# Patient Record
Sex: Male | Born: 1960
Health system: Southern US, Community
[De-identification: ages and names within clinical notes are randomized; demographics above are authoritative.]

## PROBLEM LIST (undated history)

## (undated) DIAGNOSIS — T7840XA Allergy, unspecified, initial encounter: Secondary | ICD-10-CM

## (undated) DIAGNOSIS — C4492 Squamous cell carcinoma of skin, unspecified: Secondary | ICD-10-CM

## (undated) DIAGNOSIS — F32A Depression, unspecified: Secondary | ICD-10-CM

## (undated) DIAGNOSIS — M1990 Unspecified osteoarthritis, unspecified site: Secondary | ICD-10-CM

## (undated) DIAGNOSIS — L570 Actinic keratosis: Secondary | ICD-10-CM

## (undated) DIAGNOSIS — F329 Major depressive disorder, single episode, unspecified: Secondary | ICD-10-CM

## (undated) DIAGNOSIS — C801 Malignant (primary) neoplasm, unspecified: Secondary | ICD-10-CM

## (undated) HISTORY — DX: Squamous cell carcinoma of skin, unspecified: C44.92

## (undated) HISTORY — DX: Allergy, unspecified, initial encounter: T78.40XA

## (undated) HISTORY — PX: WISDOM TOOTH EXTRACTION: SHX21

## (undated) HISTORY — DX: Depression, unspecified: F32.A

## (undated) HISTORY — DX: Unspecified osteoarthritis, unspecified site: M19.90

## (undated) HISTORY — DX: Major depressive disorder, single episode, unspecified: F32.9

## (undated) HISTORY — PX: OTHER SURGICAL HISTORY: SHX169

## (undated) HISTORY — PX: TENDON RELEASE: SHX230

## (undated) HISTORY — DX: Malignant (primary) neoplasm, unspecified: C80.1

## (undated) HISTORY — PX: TOOTH EXTRACTION: SUR596

## (undated) HISTORY — DX: Actinic keratosis: L57.0

## (undated) HISTORY — PX: ROTATOR CUFF REPAIR: SHX139

---

## 2003-01-12 ENCOUNTER — Encounter: Payer: Self-pay | Admitting: Internal Medicine

## 2003-01-12 ENCOUNTER — Encounter: Admission: RE | Admit: 2003-01-12 | Discharge: 2003-01-12 | Payer: Self-pay | Admitting: Internal Medicine

## 2011-03-15 ENCOUNTER — Encounter: Payer: Self-pay | Admitting: Internal Medicine

## 2011-03-28 ENCOUNTER — Ambulatory Visit (AMBULATORY_SURGERY_CENTER): Payer: Federal, State, Local not specified - PPO

## 2011-03-28 VITALS — Ht 64.0 in | Wt 146.3 lb

## 2011-03-28 DIAGNOSIS — Z1211 Encounter for screening for malignant neoplasm of colon: Secondary | ICD-10-CM

## 2011-03-28 MED ORDER — PEG-KCL-NACL-NASULF-NA ASC-C 100 G PO SOLR: 1.0000 | Freq: Once | ORAL | Status: AC

## 2011-03-29 ENCOUNTER — Encounter: Payer: Self-pay | Admitting: Internal Medicine

## 2011-04-10 ENCOUNTER — Other Ambulatory Visit: Payer: Self-pay | Admitting: Internal Medicine

## 2011-04-25 ENCOUNTER — Encounter: Payer: Self-pay | Admitting: Internal Medicine

## 2011-04-25 ENCOUNTER — Ambulatory Visit (AMBULATORY_SURGERY_CENTER): Payer: Federal, State, Local not specified - PPO | Admitting: Internal Medicine

## 2011-04-25 VITALS — BP 116/65 | HR 65 | Temp 97.0°F | Resp 17 | Ht 64.0 in | Wt 140.0 lb

## 2011-04-25 DIAGNOSIS — Z1211 Encounter for screening for malignant neoplasm of colon: Secondary | ICD-10-CM

## 2011-04-25 HISTORY — PX: COLONOSCOPY: SHX174

## 2011-04-25 MED ORDER — SODIUM CHLORIDE 0.9 % IV SOLN: 500.0000 mL | INTRAVENOUS | Status: DC

## 2011-04-25 NOTE — Patient Instructions (Signed)
Follow your discharge instructions.  Resume your medications.  Next Colonoscopy in 10 years.

## 2011-04-26 ENCOUNTER — Telehealth: Payer: Self-pay | Admitting: *Deleted

## 2011-04-26 NOTE — Telephone Encounter (Signed)

## 2016-05-03 DIAGNOSIS — F4320 Adjustment disorder, unspecified: Secondary | ICD-10-CM | POA: Diagnosis not present

## 2016-05-17 DIAGNOSIS — C4491 Basal cell carcinoma of skin, unspecified: Secondary | ICD-10-CM | POA: Diagnosis not present

## 2016-05-17 DIAGNOSIS — F330 Major depressive disorder, recurrent, mild: Secondary | ICD-10-CM | POA: Diagnosis not present

## 2016-05-17 DIAGNOSIS — Z0000 Encounter for general adult medical examination without abnormal findings: Secondary | ICD-10-CM | POA: Diagnosis not present

## 2016-05-17 DIAGNOSIS — Z23 Encounter for immunization: Secondary | ICD-10-CM | POA: Diagnosis not present

## 2016-05-17 DIAGNOSIS — E785 Hyperlipidemia, unspecified: Secondary | ICD-10-CM | POA: Diagnosis not present

## 2016-05-17 DIAGNOSIS — D509 Iron deficiency anemia, unspecified: Secondary | ICD-10-CM | POA: Diagnosis not present

## 2016-05-17 DIAGNOSIS — Z125 Encounter for screening for malignant neoplasm of prostate: Secondary | ICD-10-CM | POA: Diagnosis not present

## 2016-05-18 DIAGNOSIS — D3132 Benign neoplasm of left choroid: Secondary | ICD-10-CM | POA: Diagnosis not present

## 2016-05-19 DIAGNOSIS — F4320 Adjustment disorder, unspecified: Secondary | ICD-10-CM | POA: Diagnosis not present

## 2016-06-09 DIAGNOSIS — F4320 Adjustment disorder, unspecified: Secondary | ICD-10-CM | POA: Diagnosis not present

## 2016-06-30 DIAGNOSIS — F4320 Adjustment disorder, unspecified: Secondary | ICD-10-CM | POA: Diagnosis not present

## 2016-07-21 DIAGNOSIS — F4320 Adjustment disorder, unspecified: Secondary | ICD-10-CM | POA: Diagnosis not present

## 2016-08-16 DIAGNOSIS — F4320 Adjustment disorder, unspecified: Secondary | ICD-10-CM | POA: Diagnosis not present

## 2016-09-08 DIAGNOSIS — F4320 Adjustment disorder, unspecified: Secondary | ICD-10-CM | POA: Diagnosis not present

## 2016-09-29 DIAGNOSIS — F4320 Adjustment disorder, unspecified: Secondary | ICD-10-CM | POA: Diagnosis not present

## 2016-10-20 DIAGNOSIS — F4320 Adjustment disorder, unspecified: Secondary | ICD-10-CM | POA: Diagnosis not present

## 2016-11-07 DIAGNOSIS — M1711 Unilateral primary osteoarthritis, right knee: Secondary | ICD-10-CM | POA: Diagnosis not present

## 2016-11-09 DIAGNOSIS — C792 Secondary malignant neoplasm of skin: Secondary | ICD-10-CM | POA: Diagnosis not present

## 2016-11-10 DIAGNOSIS — F4320 Adjustment disorder, unspecified: Secondary | ICD-10-CM | POA: Diagnosis not present

## 2016-12-01 DIAGNOSIS — F4320 Adjustment disorder, unspecified: Secondary | ICD-10-CM | POA: Diagnosis not present

## 2016-12-28 DIAGNOSIS — M1711 Unilateral primary osteoarthritis, right knee: Secondary | ICD-10-CM | POA: Diagnosis not present

## 2016-12-29 DIAGNOSIS — F4320 Adjustment disorder, unspecified: Secondary | ICD-10-CM | POA: Diagnosis not present

## 2017-01-19 DIAGNOSIS — F4320 Adjustment disorder, unspecified: Secondary | ICD-10-CM | POA: Diagnosis not present

## 2017-02-09 DIAGNOSIS — F4320 Adjustment disorder, unspecified: Secondary | ICD-10-CM | POA: Diagnosis not present

## 2017-03-09 DIAGNOSIS — F4320 Adjustment disorder, unspecified: Secondary | ICD-10-CM | POA: Diagnosis not present

## 2017-04-06 DIAGNOSIS — F4320 Adjustment disorder, unspecified: Secondary | ICD-10-CM | POA: Diagnosis not present

## 2017-04-27 DIAGNOSIS — F4320 Adjustment disorder, unspecified: Secondary | ICD-10-CM | POA: Diagnosis not present

## 2017-05-17 DIAGNOSIS — F411 Generalized anxiety disorder: Secondary | ICD-10-CM | POA: Diagnosis not present

## 2017-05-17 DIAGNOSIS — Z23 Encounter for immunization: Secondary | ICD-10-CM | POA: Diagnosis not present

## 2017-05-17 DIAGNOSIS — C4490 Unspecified malignant neoplasm of skin, unspecified: Secondary | ICD-10-CM | POA: Diagnosis not present

## 2017-05-17 DIAGNOSIS — Z6841 Body Mass Index (BMI) 40.0 and over, adult: Secondary | ICD-10-CM | POA: Diagnosis not present

## 2017-05-17 DIAGNOSIS — Z0000 Encounter for general adult medical examination without abnormal findings: Secondary | ICD-10-CM | POA: Diagnosis not present

## 2017-06-01 DIAGNOSIS — F4320 Adjustment disorder, unspecified: Secondary | ICD-10-CM | POA: Diagnosis not present

## 2017-06-29 DIAGNOSIS — F4320 Adjustment disorder, unspecified: Secondary | ICD-10-CM | POA: Diagnosis not present

## 2017-07-20 MED FILL — DULoxetine HCL 60 MG CPEP: 60 | 90 days supply | Qty: 90 | Fill #0

## 2017-08-03 DIAGNOSIS — F4320 Adjustment disorder, unspecified: Secondary | ICD-10-CM | POA: Diagnosis not present

## 2017-08-07 DIAGNOSIS — D649 Anemia, unspecified: Secondary | ICD-10-CM | POA: Diagnosis not present

## 2017-08-31 DIAGNOSIS — F4320 Adjustment disorder, unspecified: Secondary | ICD-10-CM | POA: Diagnosis not present

## 2017-09-28 DIAGNOSIS — F4320 Adjustment disorder, unspecified: Secondary | ICD-10-CM | POA: Diagnosis not present

## 2017-10-26 DIAGNOSIS — F4320 Adjustment disorder, unspecified: Secondary | ICD-10-CM | POA: Diagnosis not present

## 2017-12-07 DIAGNOSIS — F4320 Adjustment disorder, unspecified: Secondary | ICD-10-CM | POA: Diagnosis not present

## 2017-12-28 DIAGNOSIS — F4320 Adjustment disorder, unspecified: Secondary | ICD-10-CM | POA: Diagnosis not present

## 2018-01-25 DIAGNOSIS — F4320 Adjustment disorder, unspecified: Secondary | ICD-10-CM | POA: Diagnosis not present

## 2018-02-22 DIAGNOSIS — F4320 Adjustment disorder, unspecified: Secondary | ICD-10-CM | POA: Diagnosis not present

## 2018-04-19 DIAGNOSIS — F4320 Adjustment disorder, unspecified: Secondary | ICD-10-CM | POA: Diagnosis not present

## 2018-05-17 DIAGNOSIS — F419 Anxiety disorder, unspecified: Secondary | ICD-10-CM | POA: Diagnosis not present

## 2018-05-17 DIAGNOSIS — F329 Major depressive disorder, single episode, unspecified: Secondary | ICD-10-CM | POA: Diagnosis not present

## 2018-05-17 DIAGNOSIS — Z1211 Encounter for screening for malignant neoplasm of colon: Secondary | ICD-10-CM | POA: Diagnosis not present

## 2018-05-17 DIAGNOSIS — C4490 Unspecified malignant neoplasm of skin, unspecified: Secondary | ICD-10-CM | POA: Diagnosis not present

## 2018-05-21 DIAGNOSIS — Z23 Encounter for immunization: Secondary | ICD-10-CM | POA: Diagnosis not present

## 2018-05-31 DIAGNOSIS — F4320 Adjustment disorder, unspecified: Secondary | ICD-10-CM | POA: Diagnosis not present

## 2018-07-05 DIAGNOSIS — F4320 Adjustment disorder, unspecified: Secondary | ICD-10-CM | POA: Diagnosis not present

## 2018-08-21 DIAGNOSIS — F4320 Adjustment disorder, unspecified: Secondary | ICD-10-CM | POA: Diagnosis not present

## 2018-09-20 DIAGNOSIS — F4320 Adjustment disorder, unspecified: Secondary | ICD-10-CM | POA: Diagnosis not present

## 2019-05-02 DIAGNOSIS — M25511 Pain in right shoulder: Secondary | ICD-10-CM | POA: Diagnosis not present

## 2019-05-10 DIAGNOSIS — M25511 Pain in right shoulder: Secondary | ICD-10-CM | POA: Diagnosis not present

## 2019-05-14 DIAGNOSIS — M25511 Pain in right shoulder: Secondary | ICD-10-CM | POA: Diagnosis not present

## 2019-05-20 DIAGNOSIS — C4490 Unspecified malignant neoplasm of skin, unspecified: Secondary | ICD-10-CM | POA: Diagnosis not present

## 2019-05-20 DIAGNOSIS — F418 Other specified anxiety disorders: Secondary | ICD-10-CM | POA: Diagnosis not present

## 2019-05-20 DIAGNOSIS — Z125 Encounter for screening for malignant neoplasm of prostate: Secondary | ICD-10-CM | POA: Diagnosis not present

## 2019-05-20 DIAGNOSIS — D509 Iron deficiency anemia, unspecified: Secondary | ICD-10-CM | POA: Diagnosis not present

## 2019-05-20 DIAGNOSIS — Z008 Encounter for other general examination: Secondary | ICD-10-CM | POA: Diagnosis not present

## 2019-05-20 DIAGNOSIS — Z23 Encounter for immunization: Secondary | ICD-10-CM | POA: Diagnosis not present

## 2019-05-20 DIAGNOSIS — Z1211 Encounter for screening for malignant neoplasm of colon: Secondary | ICD-10-CM | POA: Diagnosis not present

## 2019-05-27 DIAGNOSIS — X58XXXA Exposure to other specified factors, initial encounter: Secondary | ICD-10-CM | POA: Diagnosis not present

## 2019-05-27 DIAGNOSIS — Z4889 Encounter for other specified surgical aftercare: Secondary | ICD-10-CM | POA: Diagnosis not present

## 2019-05-27 DIAGNOSIS — G8918 Other acute postprocedural pain: Secondary | ICD-10-CM | POA: Diagnosis not present

## 2019-05-27 DIAGNOSIS — Y999 Unspecified external cause status: Secondary | ICD-10-CM | POA: Diagnosis not present

## 2019-05-27 DIAGNOSIS — R600 Localized edema: Secondary | ICD-10-CM | POA: Diagnosis not present

## 2019-05-27 DIAGNOSIS — S46011A Strain of muscle(s) and tendon(s) of the rotator cuff of right shoulder, initial encounter: Secondary | ICD-10-CM | POA: Diagnosis not present

## 2019-05-27 DIAGNOSIS — S43431A Superior glenoid labrum lesion of right shoulder, initial encounter: Secondary | ICD-10-CM | POA: Diagnosis not present

## 2019-05-27 DIAGNOSIS — M19011 Primary osteoarthritis, right shoulder: Secondary | ICD-10-CM | POA: Diagnosis not present

## 2019-05-27 DIAGNOSIS — M7541 Impingement syndrome of right shoulder: Secondary | ICD-10-CM | POA: Diagnosis not present

## 2019-05-27 DIAGNOSIS — M75121 Complete rotator cuff tear or rupture of right shoulder, not specified as traumatic: Secondary | ICD-10-CM | POA: Diagnosis not present

## 2019-05-27 DIAGNOSIS — S43491A Other sprain of right shoulder joint, initial encounter: Secondary | ICD-10-CM | POA: Diagnosis not present

## 2019-05-27 DIAGNOSIS — M25511 Pain in right shoulder: Secondary | ICD-10-CM | POA: Diagnosis not present

## 2019-06-04 DIAGNOSIS — M25611 Stiffness of right shoulder, not elsewhere classified: Secondary | ICD-10-CM | POA: Diagnosis not present

## 2019-06-04 DIAGNOSIS — M25511 Pain in right shoulder: Secondary | ICD-10-CM | POA: Diagnosis not present

## 2019-06-09 DIAGNOSIS — M25611 Stiffness of right shoulder, not elsewhere classified: Secondary | ICD-10-CM | POA: Diagnosis not present

## 2019-06-16 DIAGNOSIS — M25611 Stiffness of right shoulder, not elsewhere classified: Secondary | ICD-10-CM | POA: Diagnosis not present

## 2019-06-23 DIAGNOSIS — M25611 Stiffness of right shoulder, not elsewhere classified: Secondary | ICD-10-CM | POA: Diagnosis not present

## 2019-06-30 DIAGNOSIS — M25611 Stiffness of right shoulder, not elsewhere classified: Secondary | ICD-10-CM | POA: Diagnosis not present

## 2019-07-07 DIAGNOSIS — M25611 Stiffness of right shoulder, not elsewhere classified: Secondary | ICD-10-CM | POA: Diagnosis not present

## 2019-07-10 DIAGNOSIS — M25611 Stiffness of right shoulder, not elsewhere classified: Secondary | ICD-10-CM | POA: Diagnosis not present

## 2019-07-14 DIAGNOSIS — M25511 Pain in right shoulder: Secondary | ICD-10-CM | POA: Diagnosis not present

## 2019-07-14 DIAGNOSIS — M25611 Stiffness of right shoulder, not elsewhere classified: Secondary | ICD-10-CM | POA: Diagnosis not present

## 2019-07-17 DIAGNOSIS — M25611 Stiffness of right shoulder, not elsewhere classified: Secondary | ICD-10-CM | POA: Diagnosis not present

## 2019-07-21 DIAGNOSIS — M25511 Pain in right shoulder: Secondary | ICD-10-CM | POA: Diagnosis not present

## 2019-07-21 DIAGNOSIS — M25611 Stiffness of right shoulder, not elsewhere classified: Secondary | ICD-10-CM | POA: Diagnosis not present

## 2019-07-24 DIAGNOSIS — M25611 Stiffness of right shoulder, not elsewhere classified: Secondary | ICD-10-CM | POA: Diagnosis not present

## 2019-07-28 DIAGNOSIS — M25511 Pain in right shoulder: Secondary | ICD-10-CM | POA: Diagnosis not present

## 2019-07-28 DIAGNOSIS — M25611 Stiffness of right shoulder, not elsewhere classified: Secondary | ICD-10-CM | POA: Diagnosis not present

## 2019-07-31 DIAGNOSIS — M25611 Stiffness of right shoulder, not elsewhere classified: Secondary | ICD-10-CM | POA: Diagnosis not present

## 2019-07-31 DIAGNOSIS — M25511 Pain in right shoulder: Secondary | ICD-10-CM | POA: Diagnosis not present

## 2019-08-07 ENCOUNTER — Other Ambulatory Visit: Payer: Self-pay

## 2019-08-07 DIAGNOSIS — Z20822 Contact with and (suspected) exposure to covid-19: Secondary | ICD-10-CM

## 2019-08-09 LAB — NOVEL CORONAVIRUS, NAA: SARS-CoV-2, NAA: NOT DETECTED

## 2019-08-18 DIAGNOSIS — M25611 Stiffness of right shoulder, not elsewhere classified: Secondary | ICD-10-CM | POA: Diagnosis not present

## 2019-08-21 DIAGNOSIS — M25611 Stiffness of right shoulder, not elsewhere classified: Secondary | ICD-10-CM | POA: Diagnosis not present

## 2019-08-21 DIAGNOSIS — M25511 Pain in right shoulder: Secondary | ICD-10-CM | POA: Diagnosis not present

## 2019-08-25 DIAGNOSIS — M25611 Stiffness of right shoulder, not elsewhere classified: Secondary | ICD-10-CM | POA: Diagnosis not present

## 2019-08-25 DIAGNOSIS — M25511 Pain in right shoulder: Secondary | ICD-10-CM | POA: Diagnosis not present

## 2019-08-27 DIAGNOSIS — M25511 Pain in right shoulder: Secondary | ICD-10-CM | POA: Diagnosis not present

## 2019-08-27 DIAGNOSIS — M25611 Stiffness of right shoulder, not elsewhere classified: Secondary | ICD-10-CM | POA: Diagnosis not present

## 2019-09-01 DIAGNOSIS — M25611 Stiffness of right shoulder, not elsewhere classified: Secondary | ICD-10-CM | POA: Diagnosis not present

## 2019-09-01 DIAGNOSIS — M25511 Pain in right shoulder: Secondary | ICD-10-CM | POA: Diagnosis not present

## 2019-10-16 DIAGNOSIS — Z1331 Encounter for screening for depression: Secondary | ICD-10-CM | POA: Diagnosis not present

## 2019-10-16 DIAGNOSIS — F418 Other specified anxiety disorders: Secondary | ICD-10-CM | POA: Diagnosis not present

## 2019-10-16 DIAGNOSIS — Z85828 Personal history of other malignant neoplasm of skin: Secondary | ICD-10-CM | POA: Diagnosis not present

## 2019-11-20 ENCOUNTER — Ambulatory Visit: Payer: Federal, State, Local not specified - PPO | Attending: Internal Medicine

## 2019-11-20 DIAGNOSIS — Z23 Encounter for immunization: Secondary | ICD-10-CM

## 2019-11-20 NOTE — Progress Notes (Signed)
   Covid-19 Vaccination Clinic  Name:  Dakota Blair    MRN: [id number] DOB: 20-Nov-1960  11/20/2019  Mr. Dakota Blair was observed post Covid-19 immunization for 15 minutes without incident. He was provided with Vaccine Information Sheet and instruction to access the V-Safe system.   Mr. Dakota Blair was instructed to call 911 with any severe reactions post vaccine: Marland Kitchen Difficulty breathing  . Swelling of face and throat  . A fast heartbeat  . A bad rash all over body  . Dizziness and weakness   Immunizations Administered    Name Date Dose VIS Date Route   Pfizer COVID-19 Vaccine 11/20/2019  5:05 PM 0.3 mL 08/29/2019 Intramuscular   Manufacturer: [place]   Lot: [id number]   [place]: [id number]

## 2019-12-17 ENCOUNTER — Ambulatory Visit: Payer: Federal, State, Local not specified - PPO | Attending: Internal Medicine

## 2019-12-17 DIAGNOSIS — Z23 Encounter for immunization: Secondary | ICD-10-CM

## 2019-12-17 NOTE — Progress Notes (Signed)
   Covid-19 Vaccination Clinic  Name:  Dakota Blair    MRN: [id number] DOB: November 17, 1960  12/17/2019  [name] was observed post Covid-19 immunization for 15 minutes without incident. He was provided with Vaccine Information Sheet and instruction to access the V-Safe system.   [name] was instructed to call 911 with any severe reactions post vaccine: Marland Kitchen Difficulty breathing  . Swelling of face and throat  . A fast heartbeat  . A bad rash all over body  . Dizziness and weakness   Immunizations Administered    Name Date Dose VIS Date Route   Pfizer COVID-19 Vaccine 12/17/2019  4:16 PM 0.3 mL 08/29/2019 Intramuscular   Manufacturer: [place]   Lot: [id number]   [place]: [id number]

## 2020-04-09 DIAGNOSIS — F4320 Adjustment disorder, unspecified: Secondary | ICD-10-CM | POA: Diagnosis not present

## 2020-04-30 DIAGNOSIS — F4320 Adjustment disorder, unspecified: Secondary | ICD-10-CM | POA: Diagnosis not present

## 2020-06-02 DIAGNOSIS — L237 Allergic contact dermatitis due to plants, except food: Secondary | ICD-10-CM | POA: Diagnosis not present

## 2020-06-04 DIAGNOSIS — F4320 Adjustment disorder, unspecified: Secondary | ICD-10-CM | POA: Diagnosis not present

## 2020-06-09 DIAGNOSIS — Z0000 Encounter for general adult medical examination without abnormal findings: Secondary | ICD-10-CM | POA: Diagnosis not present

## 2020-06-09 DIAGNOSIS — Z125 Encounter for screening for malignant neoplasm of prostate: Secondary | ICD-10-CM | POA: Diagnosis not present

## 2020-06-09 DIAGNOSIS — E785 Hyperlipidemia, unspecified: Secondary | ICD-10-CM | POA: Diagnosis not present

## 2020-06-15 DIAGNOSIS — Z23 Encounter for immunization: Secondary | ICD-10-CM | POA: Diagnosis not present

## 2020-06-15 DIAGNOSIS — Z0000 Encounter for general adult medical examination without abnormal findings: Secondary | ICD-10-CM | POA: Diagnosis not present

## 2020-06-15 DIAGNOSIS — E785 Hyperlipidemia, unspecified: Secondary | ICD-10-CM | POA: Diagnosis not present

## 2020-06-15 DIAGNOSIS — M25551 Pain in right hip: Secondary | ICD-10-CM | POA: Diagnosis not present

## 2020-07-09 DIAGNOSIS — F4320 Adjustment disorder, unspecified: Secondary | ICD-10-CM | POA: Diagnosis not present

## 2020-07-12 DIAGNOSIS — M1611 Unilateral primary osteoarthritis, right hip: Secondary | ICD-10-CM | POA: Diagnosis not present

## 2020-07-27 DIAGNOSIS — H43391 Other vitreous opacities, right eye: Secondary | ICD-10-CM | POA: Diagnosis not present

## 2020-07-27 DIAGNOSIS — D3132 Benign neoplasm of left choroid: Secondary | ICD-10-CM | POA: Diagnosis not present

## 2020-07-27 DIAGNOSIS — H43811 Vitreous degeneration, right eye: Secondary | ICD-10-CM | POA: Diagnosis not present

## 2020-07-27 DIAGNOSIS — H33321 Round hole, right eye: Secondary | ICD-10-CM | POA: Diagnosis not present

## 2020-07-28 DIAGNOSIS — M25551 Pain in right hip: Secondary | ICD-10-CM | POA: Diagnosis not present

## 2020-08-06 DIAGNOSIS — Z1212 Encounter for screening for malignant neoplasm of rectum: Secondary | ICD-10-CM | POA: Diagnosis not present

## 2020-08-06 DIAGNOSIS — H33321 Round hole, right eye: Secondary | ICD-10-CM | POA: Diagnosis not present

## 2020-08-06 LAB — IFOBT (OCCULT BLOOD): IFOBT: NEGATIVE

## 2020-08-20 DIAGNOSIS — F4320 Adjustment disorder, unspecified: Secondary | ICD-10-CM | POA: Diagnosis not present

## 2020-09-24 DIAGNOSIS — F4320 Adjustment disorder, unspecified: Secondary | ICD-10-CM | POA: Diagnosis not present

## 2020-10-22 DIAGNOSIS — F4320 Adjustment disorder, unspecified: Secondary | ICD-10-CM | POA: Diagnosis not present

## 2020-11-26 DIAGNOSIS — F4320 Adjustment disorder, unspecified: Secondary | ICD-10-CM | POA: Diagnosis not present

## 2021-01-07 DIAGNOSIS — F4320 Adjustment disorder, unspecified: Secondary | ICD-10-CM | POA: Diagnosis not present

## 2021-02-04 DIAGNOSIS — F4320 Adjustment disorder, unspecified: Secondary | ICD-10-CM | POA: Diagnosis not present

## 2021-03-04 DIAGNOSIS — F4320 Adjustment disorder, unspecified: Secondary | ICD-10-CM | POA: Diagnosis not present

## 2021-03-23 ENCOUNTER — Encounter: Payer: Self-pay | Admitting: Internal Medicine

## 2021-04-15 DIAGNOSIS — F4320 Adjustment disorder, unspecified: Secondary | ICD-10-CM | POA: Diagnosis not present

## 2021-05-19 DIAGNOSIS — Z860101 Personal history of adenomatous and serrated colon polyps: Secondary | ICD-10-CM

## 2021-05-19 DIAGNOSIS — Z86010 Personal history of colonic polyps: Secondary | ICD-10-CM

## 2021-05-19 HISTORY — DX: Personal history of adenomatous and serrated colon polyps: Z86.0101

## 2021-05-19 HISTORY — DX: Personal history of colonic polyps: Z86.010

## 2021-05-20 DIAGNOSIS — F4320 Adjustment disorder, unspecified: Secondary | ICD-10-CM | POA: Diagnosis not present

## 2021-06-02 ENCOUNTER — Ambulatory Visit (AMBULATORY_SURGERY_CENTER): Payer: Federal, State, Local not specified - PPO | Admitting: *Deleted

## 2021-06-02 ENCOUNTER — Other Ambulatory Visit: Payer: Self-pay

## 2021-06-02 VITALS — Ht 64.0 in | Wt 148.0 lb

## 2021-06-02 DIAGNOSIS — Z1211 Encounter for screening for malignant neoplasm of colon: Secondary | ICD-10-CM

## 2021-06-02 NOTE — Progress Notes (Signed)
Pt verified name, DOB, address and insurance during PV today.  Pt mailed instruction packet of Emmi video, copy of consent form to read and not return, and instructions.  PV completed over the phone.  Pt encouraged to call with questions or issues.  My Chart instructions to pt as well    No egg or soy allergy known to patient  No issues known to pt with past sedation with any surgeries or procedures Patient denies ever being told they had issues or difficulty with intubation  No FH of Malignant Hyperthermia Pt is not on diet pills Pt is not on  home 02  Pt is not on blood thinners  Pt denies issues with constipation  No A fib or A flutter   Pt is fully vaccinated  for Covid   Due to the COVID-19 pandemic we are asking patients to follow certain guidelines.  Pt aware of COVID protocols and [facility] guidelines

## 2021-06-16 ENCOUNTER — Ambulatory Visit (AMBULATORY_SURGERY_CENTER): Payer: Federal, State, Local not specified - PPO | Admitting: Internal Medicine

## 2021-06-16 ENCOUNTER — Encounter: Payer: Self-pay | Admitting: Internal Medicine

## 2021-06-16 ENCOUNTER — Other Ambulatory Visit: Payer: Self-pay

## 2021-06-16 VITALS — BP 104/58 | HR 68 | Temp 98.0°F | Resp 17 | Ht 63.5 in | Wt 147.0 lb

## 2021-06-16 DIAGNOSIS — Z1211 Encounter for screening for malignant neoplasm of colon: Secondary | ICD-10-CM

## 2021-06-16 DIAGNOSIS — D123 Benign neoplasm of transverse colon: Secondary | ICD-10-CM

## 2021-06-16 MED ORDER — SODIUM CHLORIDE 0.9 % IV SOLN: 500.0000 mL | INTRAVENOUS | Status: DC

## 2021-06-16 NOTE — Progress Notes (Signed)
[facility] History and Physical   Primary Care Physician:  Michael Boston, MD   Reason for Procedure:   Colon cancer screening  Plan:    colonoscopy     HPI: Dakota Blair is a 60 y.o. male here for screening colonoscopy. 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 Authorizing Provider  DULoxetine (CYMBALTA) 60 MG capsule Take 60 mg by mouth daily.     Yes [provider]  loratadine (CLARITIN) 10 MG tablet Take by mouth.   Yes [provider]  Multiple Vitamins-Minerals (CENTRUM SILVER ULTRA MENS PO) Take by mouth daily.     Yes [provider]    Current Outpatient Medications  Medication Sig Dispense Refill   DULoxetine (CYMBALTA) 60 MG capsule Take 60 mg by mouth daily.       loratadine (CLARITIN) 10 MG tablet Take by mouth.     Multiple Vitamins-Minerals (CENTRUM SILVER ULTRA MENS PO) Take by mouth daily.       Current Facility-Administered Medications  Medication Dose Route Frequency Provider Last Rate Last Admin   0.9 %  sodium chloride infusion  500 mL Intravenous Continuous Gatha Mayer, MD        Allergies as of 06/16/2021 - Review Complete 06/16/2021  Allergen Reaction Noted   Penicillins Rash 03/28/2011    Family History  Problem Relation Age of Onset   Pancreatic cancer Mother    Heart disease Father    Colon cancer Neg Hx    Colon polyps Neg Hx    Esophageal cancer Neg Hx    Rectal cancer Neg Hx    Stomach cancer Neg Hx      Social History   Socioeconomic History   Marital status: Married                        Tobacco Use   Smoking status: Never   Smokeless tobacco: Never  Substance and Sexual Activity   Alcohol use: Yes    Alcohol/week: 5.0 standard drinks    Types: 5 drink(s) per week    Comment: occ   Drug use: No   Review of Systems: other review of systems negative except as mentioned in the HPI.  Physical Exam: Vital signs BP (!) 124/59   Pulse 68   Temp 98 F (36.7 C) (Temporal)   Ht 5' 3.5" (1.613 m)   Wt 147 lb (66.7 kg)   SpO2 100%   BMI 25.63 kg/m   General:   Alert,  Well-developed, well-nourished, pleasant and cooperative in NAD Lungs:  Clear throughout to auscultation.   Heart:  Regular rate and rhythm; no murmurs, clicks, rubs,  or gallops. Abdomen:  Soft, nontender and nondistended. Normal bowel sounds.   Neuro/Psych:  Alert and cooperative. Normal mood and affect. A and O  x 3   @Dakota Blair  Dakota Maffucci, MD, [facility] [phone] (pager) 06/16/2021 11:47 AM@

## 2021-06-16 NOTE — Progress Notes (Signed)
Called to room to assist during endoscopic procedure.  Patient ID and intended procedure confirmed with present staff. Received instructions for my participation in the procedure from the performing physician.  

## 2021-06-16 NOTE — Op Note (Signed)
[facility] Patient Name: Dakota Blair Procedure Date: 06/16/2021 11:45 AM MRN: [id number] Endoscopist: Gatha Mayer , MD Age: 60 Referring MD:  Date of Birth: 1961-05-09 Gender: Male Account #: [id number] Procedure:                Colonoscopy Indications:              Screening for colorectal malignant neoplasm, Last                            colonoscopy: 2012 Medicines:                Propofol per Anesthesia, Monitored Anesthesia Care Procedure:                Pre-Anesthesia Assessment:                           - Prior to the procedure, a History and Physical                            was performed, and patient medications and                            allergies were reviewed. The patient's tolerance of                            previous anesthesia was also reviewed. The risks                            and benefits of the procedure and the sedation                            options and risks were discussed with the patient.                            All questions were answered, and informed consent                            was obtained. Prior Anticoagulants: The patient has                            taken no previous anticoagulant or antiplatelet                            agents. ASA Grade Assessment: II - A patient with                            mild systemic disease. After reviewing the risks                            and benefits, the patient was deemed in                            satisfactory condition to undergo the procedure.  After obtaining informed consent, the colonoscope                            was passed under direct vision. Throughout the                            procedure, the patient's blood pressure, pulse, and                            oxygen saturations were monitored continuously. The                            Olympus CF-HQ190L ([id number]) Colonoscope was                            introduced through the  anus and advanced to the the                            cecum, identified by appendiceal orifice and                            ileocecal valve. The colonoscopy was performed                            without difficulty. The patient tolerated the                            procedure well. The quality of the bowel                            preparation was excellent. The ileocecal valve,                            appendiceal orifice, and rectum were photographed.                            The bowel preparation used was Miralax via split                            dose instruction. Scope In: 11:57:42 AM Scope Out: 12:15:43 PM Scope Withdrawal Time: 0 hours 11 minutes 10 seconds  Total Procedure Duration: 0 hours 18 minutes 1 second  Findings:                 The perianal and digital rectal examinations were                            normal. Pertinent negatives include normal prostate                            (size, shape, and consistency).                           A 1 mm polyp was found in the transverse colon. The  polyp was sessile. The polyp was removed with a                            cold biopsy forceps. Resection and retrieval were                            complete. Verification of patient identification                            for the specimen was done. Estimated blood loss was                            minimal.                           The exam was otherwise without abnormality on                            direct and retroflexion views. Complications:            No immediate complications. Estimated Blood Loss:     Estimated blood loss was minimal. Impression:               - One 1 mm polyp in the transverse colon, removed                            with a cold biopsy forceps. Resected and retrieved.                           - The examination was otherwise normal on direct                            and retroflexion views. Recommendation:            - Patient has a contact number available for                            emergencies. The signs and symptoms of potential                            delayed complications were discussed with the                            patient. Return to normal activities tomorrow.                            Written discharge instructions were provided to the                            patient.                           - Resume previous diet.                           - Continue present medications.                           -  Repeat colonoscopy is recommended. The                            colonoscopy date will be determined after pathology                            results from today's exam become available for                            review. Gatha Mayer, MD 06/16/2021 12:20:17 PM This report has been signed electronically.

## 2021-06-16 NOTE — Progress Notes (Signed)
Pt's states no medical or surgical changes since previsit or office visit. 

## 2021-06-16 NOTE — Progress Notes (Signed)
Vss nad transferred to pacu 

## 2021-06-16 NOTE — Patient Instructions (Addendum)
I found and removed one tiny polyp that looks benign. I will let you know pathology results and when to have another routine colonoscopy by mail and/or My Chart.  I appreciate the opportunity to care for you. Gatha Mayer, MD, [facility]  Please see handouts given to you on Polyps.   YOU HAD AN ENDOSCOPIC PROCEDURE TODAY AT [facility]:   Refer to the procedure report that was given to you for any specific questions about what was found during the examination.  If the procedure report does not answer your questions, please call your gastroenterologist to clarify.  If you requested that your care partner not be given the details of your procedure findings, then the procedure report has been included in a sealed envelope for you to review at your convenience later.  YOU SHOULD EXPECT: Some feelings of bloating in the abdomen. Passage of more gas than usual.  Walking can help get rid of the air that was put into your GI tract during the procedure and reduce the bloating. If you had a lower endoscopy (such as a colonoscopy or flexible sigmoidoscopy) you may notice spotting of blood in your stool or on the toilet paper. If you underwent a bowel prep for your procedure, you may not have a normal bowel movement for a few days.  Please Note:  You might notice some irritation and congestion in your nose or some drainage.  This is from the oxygen used during your procedure.  There is no need for concern and it should clear up in a day or so.  SYMPTOMS TO REPORT IMMEDIATELY:  Following lower endoscopy (colonoscopy or flexible sigmoidoscopy):  Excessive amounts of blood in the stool  Significant tenderness or worsening of abdominal pains  Swelling of the abdomen that is new, acute  Fever of 100F or higher   For urgent or emergent issues, a gastroenterologist can be reached at any hour by calling [phone]. Do not use MyChart messaging for urgent concerns.    DIET:  We do recommend  a small meal at first, but then you may proceed to your regular diet.  Drink plenty of fluids but you should avoid alcoholic beverages for 24 hours.  ACTIVITY:  You should plan to take it easy for the rest of today and you should NOT DRIVE or use heavy machinery until tomorrow (because of the sedation medicines used during the test).    FOLLOW UP: Our staff will call the number listed on your records 48-72 hours following your procedure to check on you and address any questions or concerns that you may have regarding the information given to you following your procedure. If we do not reach you, we will leave a message.  We will attempt to reach you two times.  During this call, we will ask if you have developed any symptoms of COVID 19. If you develop any symptoms (ie: fever, flu-like symptoms, shortness of breath, cough etc.) before then, please call [phone].  If you test positive for Covid 19 in the 2 weeks post procedure, please call and report this information to [place].    If any biopsies were taken you will be contacted by phone or by letter within the next 1-3 weeks.  Please call us at [phone] if you have not heard about the biopsies in 3 weeks.    SIGNATURES/CONFIDENTIALITY: You and/or your care partner have signed paperwork which will be entered into your electronic medical record.  These signatures attest  to the fact that that the information above on your After Visit Summary has been reviewed and is understood.  Full responsibility of the confidentiality of this discharge information lies with you and/or your care-partner.

## 2021-06-20 ENCOUNTER — Telehealth: Payer: Self-pay

## 2021-06-20 NOTE — Telephone Encounter (Signed)
  Follow up Call-  Call back number 06/16/2021  Post procedure Call Back phone  # [phone]  Permission to leave phone message Yes  Some recent data might be hidden     Patient questions:  Do you have a fever, pain , or abdominal swelling? No. Pain Score  0 *  Have you tolerated food without any problems? Yes.    Have you been able to return to your normal activities? Yes.    Do you have any questions about your discharge instructions: Diet   No. Medications  No. Follow up visit  No.  Do you have questions or concerns about your Care? No.  Actions: * If pain score is 4 or above: No action needed, pain <4.  Have you developed a fever since your procedure? no  2.   Have you had an respiratory symptoms (SOB or cough) since your procedure? no  3.   Have you tested positive for COVID 19 since your procedure no  4.   Have you had any family members/close contacts diagnosed with the COVID 19 since your procedure?  no   If yes to any of these questions please route to Joylene John, RN and Joella Prince, RN

## 2021-06-20 NOTE — Telephone Encounter (Signed)
Opened in error

## 2021-06-24 DIAGNOSIS — Z125 Encounter for screening for malignant neoplasm of prostate: Secondary | ICD-10-CM | POA: Diagnosis not present

## 2021-06-24 DIAGNOSIS — E785 Hyperlipidemia, unspecified: Secondary | ICD-10-CM | POA: Diagnosis not present

## 2021-06-27 ENCOUNTER — Encounter: Payer: Self-pay | Admitting: Internal Medicine

## 2021-07-12 DIAGNOSIS — Z20822 Contact with and (suspected) exposure to covid-19: Secondary | ICD-10-CM | POA: Diagnosis not present

## 2021-07-15 DIAGNOSIS — F4320 Adjustment disorder, unspecified: Secondary | ICD-10-CM | POA: Diagnosis not present

## 2021-07-21 DIAGNOSIS — Z0000 Encounter for general adult medical examination without abnormal findings: Secondary | ICD-10-CM | POA: Diagnosis not present

## 2021-07-21 DIAGNOSIS — Z1389 Encounter for screening for other disorder: Secondary | ICD-10-CM | POA: Diagnosis not present

## 2021-07-21 DIAGNOSIS — Z1331 Encounter for screening for depression: Secondary | ICD-10-CM | POA: Diagnosis not present

## 2021-07-21 DIAGNOSIS — E785 Hyperlipidemia, unspecified: Secondary | ICD-10-CM | POA: Diagnosis not present

## 2021-08-05 ENCOUNTER — Other Ambulatory Visit: Payer: Self-pay | Admitting: Internal Medicine

## 2021-08-05 DIAGNOSIS — E785 Hyperlipidemia, unspecified: Secondary | ICD-10-CM

## 2021-08-19 DIAGNOSIS — F4320 Adjustment disorder, unspecified: Secondary | ICD-10-CM | POA: Diagnosis not present

## 2021-08-19 DIAGNOSIS — M25551 Pain in right hip: Secondary | ICD-10-CM | POA: Diagnosis not present

## 2021-09-01 ENCOUNTER — Ambulatory Visit
Admission: RE | Admit: 2021-09-01 | Discharge: 2021-09-01 | Disposition: A | Discharge status: Home / Self Care | Payer: No Typology Code available for payment source | Source: Ambulatory Visit | Attending: Internal Medicine | Admitting: Internal Medicine

## 2021-09-01 DIAGNOSIS — E785 Hyperlipidemia, unspecified: Secondary | ICD-10-CM | POA: Diagnosis not present

## 2021-09-01 DIAGNOSIS — Z8249 Family history of ischemic heart disease and other diseases of the circulatory system: Secondary | ICD-10-CM | POA: Diagnosis not present

## 2021-09-27 DIAGNOSIS — F4320 Adjustment disorder, unspecified: Secondary | ICD-10-CM | POA: Diagnosis not present

## 2021-10-06 ENCOUNTER — Encounter: Payer: Self-pay | Admitting: Internal Medicine

## 2021-10-06 ENCOUNTER — Other Ambulatory Visit: Payer: Self-pay

## 2021-10-06 ENCOUNTER — Ambulatory Visit (INDEPENDENT_AMBULATORY_CARE_PROVIDER_SITE_OTHER): Payer: Federal, State, Local not specified - PPO | Admitting: Internal Medicine

## 2021-10-06 VITALS — BP 127/72 | HR 76 | Ht 64.0 in | Wt 151.4 lb

## 2021-10-06 DIAGNOSIS — Z8249 Family history of ischemic heart disease and other diseases of the circulatory system: Secondary | ICD-10-CM

## 2021-10-06 DIAGNOSIS — I2584 Coronary atherosclerosis due to calcified coronary lesion: Secondary | ICD-10-CM

## 2021-10-06 DIAGNOSIS — I2510 Atherosclerotic heart disease of native coronary artery without angina pectoris: Secondary | ICD-10-CM | POA: Diagnosis not present

## 2021-10-06 DIAGNOSIS — Z01810 Encounter for preprocedural cardiovascular examination: Secondary | ICD-10-CM | POA: Diagnosis not present

## 2021-10-06 DIAGNOSIS — E785 Hyperlipidemia, unspecified: Secondary | ICD-10-CM

## 2021-10-06 MED ORDER — ROSUVASTATIN CALCIUM 20 MG PO TABS: 20.0000 mg | ORAL_TABLET | Freq: Every day | ORAL | 3 refills | Status: DC

## 2021-10-06 NOTE — Progress Notes (Signed)
OFFICE CONSULT NOTE  Chief Complaint:  Elevated coronary calcium score, preoperative clearance  Primary Care Physician: Michael Boston, MD  HPI:  Dakota Blair is a 61 y.o. male who is being seen today for the evaluation of elevated coronary calcium score, preoperative clearance at the request of Jacalyn Lefevre, Jesse Sans, MD. 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  He presents today for preoperative risk assessment for right hip replacement.  Recently he underwent coronary calcium scoring which showed a calcium score of 205, 77th percentile for age and sex matched controls.  This was done for screening purposes due to family history of heart disease including a father with MI and CABG at age 38 and ultimately who died at 41 of MI.  Additionally he has dyslipidemia with recent total cholesterol 231, triglyceride 96, HDL 61 and LDL 151.  He was placed on rosuvastatin 10 mg daily based on his elevated calcium score.  He denies any chest pain or worsening shortness of breath with exertion and has been physically active most of his life particularly in the [employer].  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   Depression    Hx of adenomatous polyp of colon 05/2021   72mm adenoma repeat exam 2029-32    Past Surgical History:  Procedure Laterality Date   COLONOSCOPY  04/25/2011   Normal   moles removed     ROTATOR CUFF REPAIR Right    TENDON RELEASE Right    right thumb   TOOTH EXTRACTION     with implant and small bone graft   WISDOM TOOTH EXTRACTION      FAMHx:  Family History  Problem Relation Age of Onset   Pancreatic cancer Mother    Heart disease Father    Colon cancer Neg Hx    Colon polyps Neg Hx    Esophageal cancer Neg Hx    Rectal cancer Neg Hx    Stomach cancer Neg Hx     SOCHx:   reports  that he has never smoked. He has never used smokeless tobacco. He reports current alcohol use of about 5.0 standard drinks per week. He reports that he does not use drugs.  ALLERGIES:  Allergies  Allergen Reactions   Penicillins Rash    ROS: Pertinent items noted in HPI and remainder of comprehensive ROS otherwise negative.  HOME MEDS: Current Outpatient Medications on File Prior to Visit  Medication Sig Dispense Refill   DULoxetine (CYMBALTA) 60 MG capsule Take 60 mg by mouth daily.       loratadine (CLARITIN) 10 MG tablet Take by mouth.     Multiple Vitamins-Minerals (CENTRUM SILVER ULTRA MENS PO) Take by mouth daily.       No current facility-administered medications on file prior to visit.    LABS/IMAGING: No results found for this or any previous visit (from the past 48 hour(s)). No results found.  LIPID PANEL: No results found for: CHOL, TRIG, HDL, CHOLHDL, VLDL, LDLCALC, LDLDIRECT  WEIGHTS: Wt Readings from Last 3 Encounters:  10/06/21 151 lb 6.4 oz (68.7 kg)  06/16/21 147 lb (66.7 kg)  06/02/21 148 lb (67.1 kg)    VITALS: BP 127/72    Pulse 76    Ht 5'  4" (1.626 m)    Wt 151 lb 6.4 oz (68.7 kg)    SpO2 98%    BMI 25.99 kg/m   EXAM: General appearance: alert and no distress Neck: no carotid bruit, no JVD, and thyroid not enlarged, symmetric, no tenderness/mass/nodules Lungs: clear to auscultation bilaterally Heart: regular rate and rhythm Abdomen: soft, non-tender; bowel sounds normal; no masses,  no organomegaly Extremities: extremities normal, atraumatic, no cyanosis or edema Pulses: 2+ and symmetric Skin: Skin color, texture, turgor normal. No rashes or lesions Neurologic: Grossly normal Psych: Pleasant  EKG: Normal sinus rhythm at 76- personally reviewed  ASSESSMENT: Acceptable risk for upcoming hip surgery Abnormal CAC score of 205, 77th percentile (08/2021) Dyslipidemia, goal LDL less than 70 Family history of premature coronary disease in his  father  PLAN: 1.   [name] is at acceptable risk for upcoming hip surgery.  No further testing is necessary.  He did have an elevated calcium score in the upper quartile.  He will need more aggressive risk factor modification.  I would advise high intensity statin which would be rosuvastatin 20 mg daily.  He will increase his current dose and hopefully this will get his LDL closer to 70.  We will plan follow-up after his surgery.  Thanks again for the kind referral.  Pixie Casino, MD, FACC, [place] Director of the Advanced Lipid Disorders &  Cardiovascular Risk Reduction Clinic Diplomate of the American Board of Clinical Lipidology Attending Cardiologist  Direct Dial: [phone]   Fax: [phone]  Website:  [email or web address] 10/06/2021, 10:52 PM

## 2021-10-06 NOTE — Patient Instructions (Signed)
Medication Instructions:  INCREASE crestor to 20mg  daily  *If you need a refill on your cardiac medications before your next appointment, please call your pharmacy*   Lab Work: FASTING lab work to check cholesterol in 3-4 months  -- complete about 1 week before your next apointment   If you have labs (blood work) drawn today and your tests are completely normal, you will receive your results only by: [place] (if you have MyChart) OR A paper copy in the mail If you have any lab test that is abnormal or we need to change your treatment, we will call you to review the results.   Testing/Procedures: NONE   Follow-Up: At [facility], you and your health needs are our priority.  As part of our continuing mission to provide you with exceptional heart care, we have created designated Provider Care Teams.  These Care Teams include your primary Cardiologist (physician) and Advanced Practice Providers (APPs -  Physician Assistants and Nurse Practitioners) who all work together to provide you with the care you need, when you need it.  We recommend signing up for the patient portal called "MyChart".  Sign up information is provided on this After Visit Summary.  MyChart is used to connect with patients for Virtual Visits (Telemedicine).  Patients are able to view lab/test results, encounter notes, upcoming appointments, etc.  Non-urgent messages can be sent to your provider as well.   To learn more about what you can do with MyChart, go to [email or web address].    Your next appointment:   3-4 months -- lipid clinic

## 2021-10-07 DIAGNOSIS — M1611 Unilateral primary osteoarthritis, right hip: Secondary | ICD-10-CM | POA: Diagnosis not present

## 2021-10-20 DIAGNOSIS — Z0189 Encounter for other specified special examinations: Secondary | ICD-10-CM | POA: Diagnosis not present

## 2021-11-04 DIAGNOSIS — F4320 Adjustment disorder, unspecified: Secondary | ICD-10-CM | POA: Diagnosis not present

## 2021-11-08 DIAGNOSIS — M1611 Unilateral primary osteoarthritis, right hip: Secondary | ICD-10-CM | POA: Diagnosis not present

## 2021-12-13 DIAGNOSIS — Z4789 Encounter for other orthopedic aftercare: Secondary | ICD-10-CM | POA: Diagnosis not present

## 2022-01-17 DIAGNOSIS — I2510 Atherosclerotic heart disease of native coronary artery without angina pectoris: Secondary | ICD-10-CM | POA: Diagnosis not present

## 2022-01-17 DIAGNOSIS — Z8249 Family history of ischemic heart disease and other diseases of the circulatory system: Secondary | ICD-10-CM | POA: Diagnosis not present

## 2022-01-17 DIAGNOSIS — I2584 Coronary atherosclerosis due to calcified coronary lesion: Secondary | ICD-10-CM | POA: Diagnosis not present

## 2022-01-17 DIAGNOSIS — E785 Hyperlipidemia, unspecified: Secondary | ICD-10-CM | POA: Diagnosis not present

## 2022-01-18 LAB — NMR, LIPOPROFILE
Cholesterol, Total: 168 mg/dL (ref 100–199)
HDL Particle Number: 47.6 umol/L (ref >=30.5)
HDL-C: 69 mg/dL (ref >39)
LDL Particle Number: 849 nmol/L (ref <1000)
LDL Size: 20.5 nm — ABNORMAL LOW (ref >20.5)
LDL-C (NIH Calc): 81 mg/dL (ref 0–99)
LP-IR Score: 46 — ABNORMAL HIGH (ref <=45)
Small LDL Particle Number: 469 nmol/L (ref <=527)
Triglycerides: 100 mg/dL (ref 0–149)

## 2022-01-18 LAB — LIPOPROTEIN A (LPA): Lipoprotein (a): 9 nmol/L (ref <75.0)

## 2022-01-27 ENCOUNTER — Telehealth (INDEPENDENT_AMBULATORY_CARE_PROVIDER_SITE_OTHER): Payer: Federal, State, Local not specified - PPO | Admitting: Internal Medicine

## 2022-01-27 VITALS — Ht 64.0 in | Wt 148.0 lb

## 2022-01-27 DIAGNOSIS — Z8249 Family history of ischemic heart disease and other diseases of the circulatory system: Secondary | ICD-10-CM

## 2022-01-27 DIAGNOSIS — I2584 Coronary atherosclerosis due to calcified coronary lesion: Secondary | ICD-10-CM

## 2022-01-27 DIAGNOSIS — I2510 Atherosclerotic heart disease of native coronary artery without angina pectoris: Secondary | ICD-10-CM | POA: Diagnosis not present

## 2022-01-27 DIAGNOSIS — E785 Hyperlipidemia, unspecified: Secondary | ICD-10-CM | POA: Diagnosis not present

## 2022-01-27 NOTE — Progress Notes (Signed)
? ?Virtual Visit via Video Note  ? ?This visit type was conducted due to national recommendations for restrictions regarding the COVID-19 Pandemic (e.g. social distancing) in an effort to limit this patient's exposure and mitigate transmission in our community.  Due to his co-morbid illnesses, this patient is at least at moderate risk for complications without adequate follow up.  This format is felt to be most appropriate for this patient at this time.  All issues noted in this document were discussed and addressed.  A limited physical exam was performed with this format.  Please refer to the patient's chart for his consent to telehealth for [facility]. ? ?   ? ?Date:  01/27/2022  ? ?ID:  Dakota Blair, DOB Jan 23, 1961, MRN [id number] ?The patient was identified using 2 identifiers. ? ?Evaluation Performed:  Follow-Up Visit ? ?Patient Location:  ?[address] ?[place] [address] ? ?Provider location:   ?[address], [address] ?[place], [place] [address] ? ?PCP:  Michael Boston, MD  ?Cardiologist:  None ?Electrophysiologist:  None  ? ?Chief Complaint:  Follow-up dyslipidemia ? ?History of Present Illness:   ? ?Dakota Blair is a 61 y.o. male who presents via audio/video conferencing for a telehealth visit today.  Dakota Blair is a 61 y.o. male who is being seen today for the evaluation of elevated coronary calcium score, preoperative clearance at the request of Jacalyn Lefevre, Jesse Sans, MD. 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  He presents today for preoperative risk assessment for right hip replacement.  Recently he underwent coronary calcium scoring which showed a calcium score of 205, 77th percentile for age and sex matched controls.  This was done for screening purposes due to family history of heart disease including a father with MI and CABG at age 10 and ultimately who died at 79 of MI.  Additionally he has dyslipidemia with recent  total cholesterol 231, triglyceride 96, HDL 61 and LDL 151.  He was placed on rosuvastatin 10 mg daily based on his elevated calcium score.  He denies any chest pain or worsening shortness of breath with exertion and has been physically active most of his life particularly in the [employer] ? ?01/27/2022 ? ?Mr. Pelot returns for follow-up. He had successful hip surgery and is now walking better. His lipids have improved significantly. LDL-P is now 849, LDL-C 81, HDL-C 69, TG 100, Small LDL-P of 469.  His LP(a) was negative at 8.  He is tolerating higher dose rosuvastatin. ? ?The patient does not have symptoms concerning for COVID-19 infection (fever, chills, cough, or new SHORTNESS OF BREATH).  ? ? ?Prior CV studies:   ?The following studies were reviewed today: ? ?Labwork ? ?PMHx:  ?Past Medical History:  ?Diagnosis Date  ? Allergy   ? Arthritis   ? OA hip -  ? Cancer [facility])   ? 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 Depression   ? Hx of adenomatous polyp of colon 05/2021  ? 30m adenoma repeat exam 2029-32  ? ? ?Past Surgical History:  ?Procedure Laterality Date  ? COLONOSCOPY  04/25/2011  ? Normal  ? moles removed    ? ROTATOR CUFF REPAIR Right   ? TENDON RELEASE Right   ? right thumb  ? TOOTH EXTRACTION    ? with implant and small bone graft  ? WISDOM TOOTH EXTRACTION    ? ? ?  FAMHx:  ?Family History  ?Problem Relation Age of Onset  ? Pancreatic cancer Mother   ? Heart disease Father   ? Colon cancer Neg Hx   ? Colon polyps Neg Hx   ? Esophageal cancer Neg Hx   ? Rectal cancer Neg Hx   ? Stomach cancer Neg Hx   ? ? ?SOCHx:  ? reports that he has never smoked. He has never used smokeless tobacco. He reports current alcohol use of about 5.0 standard drinks per week. He reports that he does not use drugs. ? ?ALLERGIES:  ?Allergies  ?Allergen Reactions  ? Penicillins Rash  ? ? ?MEDS: ? ?Current Meds  ?Medication Sig  ? DULoxetine (CYMBALTA) 60 MG capsule Take 60 mg by mouth daily.    ? loratadine  (CLARITIN) 10 MG tablet Take by mouth.  ? rosuvastatin (CRESTOR) 20 MG tablet Take 1 tablet (20 mg total) by mouth daily.  ?  ? ?ROS: ?Pertinent items noted in HPI and remainder of comprehensive ROS otherwise negative. ? ?Labs/Other Tests and Data Reviewed:   ? ?Recent Labs: ?No results found for requested labs within last 8760 hours.  ? ?Recent Lipid Panel ?No results found for: CHOL, TRIG, HDL, CHOLHDL, LDLCALC, LDLDIRECT ? ?Wt Readings from Last 3 Encounters:  ?01/27/22 148 lb (67.1 kg)  ?10/06/21 151 lb 6.4 oz (68.7 kg)  ?06/16/21 147 lb (66.7 kg)  ?  ? ?Exam:   ? ?Vital Signs:  Ht '5\' 4"'$  (1.626 m)   Wt 148 lb (67.1 kg)   BMI 25.40 kg/m?   ? ?General appearance: alert and no distress ?Lungs: no visual respiratory difficulty ?Abdomen: normal weight ?Extremities: extremities normal, atraumatic, no cyanosis or edema ?Skin: Skin color, texture, turgor normal. No rashes or lesions ?Neurologic: Grossly normal ?P ? ?ASSESSMENT & PLAN:   ? ?Acceptable risk for upcoming hip surgery ?Abnormal CAC score of 205, 77th percentile (08/2021) ?Dyslipidemia, goal LDL less than 70 ?Family history of premature coronary disease in his father ? ?Mr. Debellis has had a significant reduction in cholesterol on high intensity rosuvastatin. He seems to be tolerating this well. Particle numbers are low - LDL-C is still just above target. I think it will improve further since he is more active now after his hip surgery. Will continue current therapy and plan follow-up with repeat lipid NMR in 6 months. ? ?COVID-19 Education: ?The signs and symptoms of COVID-19 were discussed with the patient and how to seek care for testing (follow up with PCP or arrange E-visit).  The importance of social distancing was discussed today. ? ?Patient Risk:   ?After full review of this patients clinical status, I feel that they are at least moderate risk at this time. ? ?Time:   ?Today, I have spent 15 minutes with the patient with telehealth technology  discussing dyslipidemia.   ? ? ?Medication Adjustments/Labs and Tests Ordered: ?Current medicines are reviewed at length with the patient today.  Concerns regarding medicines are outlined above.  ? ?Tests Ordered: ?Orders Placed This Encounter  ?Procedures  ? Lipid panel  ? NMR, lipoprofile  ? ? ?Medication Changes: ?No orders of the defined types were placed in this encounter. ? ? ?Disposition:  in 6 month(s) ? ?Pixie Casino, MD, [facility], FACP  ?[place]  ?Medical Director of the Advanced Lipid Disorders &  ?Cardiovascular Risk Reduction Clinic ?Diplomate of the [employer] of Clinical Lipidology ?Attending Cardiologist  ?Direct Dial: [phone]  Fax: [phone]  ?Website:  [email or web address] ? ?  Pixie Casino, MD  ?01/27/2022 8:27 AM    ? ? ?

## 2022-01-27 NOTE — Patient Instructions (Signed)
Medication Instructions:  ?Your physician recommends that you continue on your current medications as directed. Please refer to the Current Medication list given to you today. ? ?*If you need a refill on your cardiac medications before your next appointment, please call your pharmacy* ? ? ?Lab Work: ?Your physician recommends that you return for lab work in: 6 months for FASTING cholesterol levels. ? ?If you have labs (blood work) drawn today and your tests are completely normal, you will receive your results only by: ?MyChart Message (if you have MyChart) OR ?A paper copy in the mail ?If you have any lab test that is abnormal or we need to change your treatment, we will call you to review the results. ? ? ?Follow-Up: ?At [facility], you and your health needs are our priority.  As part of our continuing mission to provide you with exceptional heart care, we have created designated Provider Care Teams.  These Care Teams include your primary Cardiologist (physician) and Advanced Practice Providers (APPs -  Physician Assistants and Nurse Practitioners) who all work together to provide you with the care you need, when you need it. ? ?We recommend signing up for the patient portal called "MyChart".  Sign up information is provided on this After Visit Summary.  MyChart is used to connect with patients for Virtual Visits (Telemedicine).  Patients are able to view lab/test results, encounter notes, upcoming appointments, etc.  Non-urgent messages can be sent to your provider as well.   ?To learn more about what you can do with MyChart, go to [email or web address].   ? ?Your next appointment:   ?Monday, November, 6th @ 8am ? ?The format for your next appointment:   ?In Person ? ?Provider:   ?Dr. Debara Pickett ? ?Other Instructions ?Lipid Clinic ?

## 2022-03-10 DIAGNOSIS — M25531 Pain in right wrist: Secondary | ICD-10-CM | POA: Diagnosis not present

## 2022-03-10 DIAGNOSIS — M79641 Pain in right hand: Secondary | ICD-10-CM | POA: Diagnosis not present

## 2022-03-30 DIAGNOSIS — S62111A Displaced fracture of triquetrum [cuneiform] bone, right wrist, initial encounter for closed fracture: Secondary | ICD-10-CM | POA: Diagnosis not present

## 2022-03-30 DIAGNOSIS — M25531 Pain in right wrist: Secondary | ICD-10-CM | POA: Diagnosis not present

## 2022-03-30 DIAGNOSIS — M79641 Pain in right hand: Secondary | ICD-10-CM | POA: Diagnosis not present

## 2022-04-20 DIAGNOSIS — S62111A Displaced fracture of triquetrum [cuneiform] bone, right wrist, initial encounter for closed fracture: Secondary | ICD-10-CM | POA: Diagnosis not present

## 2022-04-20 DIAGNOSIS — M79641 Pain in right hand: Secondary | ICD-10-CM | POA: Diagnosis not present

## 2022-04-23 ENCOUNTER — Telehealth: Payer: Federal, State, Local not specified - PPO | Admitting: Nurse Practitioner

## 2022-04-23 DIAGNOSIS — L739 Follicular disorder, unspecified: Secondary | ICD-10-CM

## 2022-04-23 MED ORDER — SULFAMETHOXAZOLE-TRIMETHOPRIM 800-160 MG PO TABS: 1.0000 | ORAL_TABLET | Freq: Two times a day (BID) | ORAL | 0 refills | Status: AC

## 2022-04-23 NOTE — Progress Notes (Signed)
Virtual Visit Consent   [name], you are scheduled for a virtual visit with a [facility] provider today. Just as with appointments in the office, your consent must be obtained to participate. Your consent will be active for this visit and any virtual visit you may have with one of our providers in the next 365 days. If you have a MyChart account, a copy of this consent can be sent to you electronically.  As this is a virtual visit, video technology does not allow for your provider to perform a traditional examination. This may limit your provider's ability to fully assess your condition. If your provider identifies any concerns that need to be evaluated in person or the need to arrange testing (such as labs, EKG, etc.), we will make arrangements to do so. Although advances in technology are sophisticated, we cannot ensure that it will always work on either your end or our end. If the connection with a video visit is poor, the visit may have to be switched to a telephone visit. With either a video or telephone visit, we are not always able to ensure that we have a secure connection.  By engaging in this virtual visit, you consent to the provision of healthcare and authorize for your insurance to be billed (if applicable) for the services provided during this visit. Depending on your insurance coverage, you may receive a charge related to this service.  I need to obtain your verbal consent now. Are you willing to proceed with your visit today? [name] has provided verbal consent on 04/23/2022 for a virtual visit (video or telephone). [name], NP  Date: 04/23/2022 6:42 PM  Virtual Visit via Video Note   I, [name], connected with  [name]  ([id number], 1961/01/10) on 04/23/22 at  6:30 PM EDT by a video-enabled telemedicine application and verified that I am speaking with the correct person using two identifiers.  Location: Patient: Virtual Visit Location Patient:  Home Provider: Virtual Visit Location Provider: Home Office   I discussed the limitations of evaluation and management by telemedicine and the availability of in person appointments. The patient expressed understanding and agreed to proceed.    History of Present Illness: Dakota Blair is a 61 y.o. who identifies as a male who was assigned male at birth, and is being seen today for folliculitis.   Notes 4 day onset of papular erythematous lesion on left cheek. Area is tender to touch. Despite using neosporin the lesion has not decreased in size.  Patient does not use tobacco. Patient does not have a history of diabetes. Denies any dental issues/abnormalities. There is no facial asymmetry or lesions present on any other areas of body.    Problems: There are no problems to display for this patient.   Allergies:  Allergies  Allergen Reactions   Penicillins Rash   Medications:  Current Outpatient Medications:    sulfamethoxazole-trimethoprim (BACTRIM DS) 800-160 MG tablet, Take 1 tablet by mouth 2 (two) times daily for 7 days., Disp: 14 tablet, Rfl: 0   DULoxetine (CYMBALTA) 60 MG capsule, Take 60 mg by mouth daily.  , Disp: , Rfl:    loratadine (CLARITIN) 10 MG tablet, Take by mouth., Disp: , Rfl:    Multiple Vitamins-Minerals (CENTRUM SILVER ULTRA MENS PO), Take by mouth daily.   (Patient not taking: Reported on 01/27/2022), Disp: , Rfl:    rosuvastatin (CRESTOR) 20 MG tablet, Take 1 tablet (20 mg total) by mouth  daily., Disp: 90 tablet, Rfl: 3  Observations/Objective: Patient is well-developed, well-nourished in no acute distress.  Resting comfortably  at home.  Head is normocephalic, atraumatic.  No labored breathing.  Speech is clear and coherent with logical content.  Patient is alert and oriented at baseline.    Assessment and Plan: 1. Folliculitis - sulfamethoxazole-trimethoprim (BACTRIM DS) 800-160 MG tablet; Take 1 tablet by mouth 2 (two) times daily for 7 days.  Dispense:  14 tablet; Refill: 0  Continue neosporin as instructed  Follow Up Instructions: I discussed the assessment and treatment plan with the patient. The patient was provided an opportunity to ask questions and all were answered. The patient agreed with the plan and demonstrated an understanding of the instructions.  A copy of instructions were sent to the patient via MyChart unless otherwise noted below.    The patient was advised to call back or seek an in-person evaluation if the symptoms worsen or if the condition fails to improve as anticipated.  Time:  I spent 11 minutes with the patient via telehealth technology discussing the above problems/concerns.    [name], NP

## 2022-04-23 NOTE — Patient Instructions (Signed)
  Dakota Blair, thank you for joining Dakota Pounds, NP for today's virtual visit.  While this provider is not your primary care provider (PCP), if your PCP is located in our provider database this encounter information will be shared with them immediately following your visit.  Consent: (Patient) Dakota Blair provided verbal consent for this virtual visit at the beginning of the encounter.  Current Medications:  Current Outpatient Medications:    sulfamethoxazole-trimethoprim (BACTRIM DS) 800-160 MG tablet, Take 1 tablet by mouth 2 (two) times daily for 7 days., Disp: 14 tablet, Rfl: 0   DULoxetine (CYMBALTA) 60 MG capsule, Take 60 mg by mouth daily.  , Disp: , Rfl:    loratadine (CLARITIN) 10 MG tablet, Take by mouth., Disp: , Rfl:    Multiple Vitamins-Minerals (CENTRUM SILVER ULTRA MENS PO), Take by mouth daily.   (Patient not taking: Reported on 01/27/2022), Disp: , Rfl:    rosuvastatin (CRESTOR) 20 MG tablet, Take 1 tablet (20 mg total) by mouth daily., Disp: 90 tablet, Rfl: 3   Medications ordered in this encounter:  Meds ordered this encounter  Medications   sulfamethoxazole-trimethoprim (BACTRIM DS) 800-160 MG tablet    Sig: Take 1 tablet by mouth 2 (two) times daily for 7 days.    Dispense:  14 tablet    Refill:  0    Order Specific Question:   Supervising Provider    Answer:   Sabra Heck, BRIAN [[id number]]     *If you need refills on other medications prior to your next appointment, please contact your pharmacy*  Follow-Up: Call back or seek an in-person evaluation if the symptoms worsen or if the condition fails to improve as anticipated.  Other Instructions Continue to apply neosporin to affected area   If you have been instructed to have an in-person evaluation today at a local Urgent Care facility, please use the link below. It will take you to a list of all of our available [facility], including address, phone number and hours of operation. Please do not delay  care.  [facility]  If you or a family member do not have a primary care provider, use the link below to schedule a visit and establish care. When you choose a [facility] primary care physician or advanced practice provider, you gain a long-term partner in health. Find a Primary Care Provider  Learn more about [facility]'s in-office and virtual care options: [facility]

## 2022-07-17 DIAGNOSIS — E785 Hyperlipidemia, unspecified: Secondary | ICD-10-CM | POA: Diagnosis not present

## 2022-07-18 LAB — NMR, LIPOPROFILE
Cholesterol, Total: 153 mg/dL (ref 100–199)
HDL Particle Number: 43.6 umol/L (ref >=30.5)
HDL-C: 74 mg/dL (ref >39)
LDL Particle Number: 576 nmol/L (ref <1000)
LDL Size: 20.7 nm (ref >20.5)
LDL-C (NIH Calc): 66 mg/dL (ref 0–99)
LP-IR Score: 32 (ref <=45)
Small LDL Particle Number: 380 nmol/L (ref <=527)
Triglycerides: 64 mg/dL (ref 0–149)

## 2022-07-24 ENCOUNTER — Encounter: Payer: Self-pay | Admitting: Internal Medicine

## 2022-07-24 ENCOUNTER — Ambulatory Visit: Payer: Federal, State, Local not specified - PPO | Attending: Internal Medicine | Admitting: Internal Medicine

## 2022-07-24 VITALS — BP 122/68 | HR 78 | Ht 64.0 in | Wt 151.0 lb

## 2022-07-24 DIAGNOSIS — E785 Hyperlipidemia, unspecified: Secondary | ICD-10-CM

## 2022-07-24 DIAGNOSIS — Z8249 Family history of ischemic heart disease and other diseases of the circulatory system: Secondary | ICD-10-CM

## 2022-07-24 DIAGNOSIS — I2510 Atherosclerotic heart disease of native coronary artery without angina pectoris: Secondary | ICD-10-CM | POA: Diagnosis not present

## 2022-07-24 DIAGNOSIS — I2584 Coronary atherosclerosis due to calcified coronary lesion: Secondary | ICD-10-CM | POA: Diagnosis not present

## 2022-07-24 NOTE — Progress Notes (Signed)
OFFICE CONSULT NOTE  Chief Complaint:  Elevated coronary calcium score, preoperative clearance  Primary Care Physician: Dakota Boston, MD  HPI:  Dakota Blair is a 61 y.o. male who is being seen today for the evaluation of elevated coronary calcium score, preoperative clearance at the request of Dakota Blair, Dakota Sans, MD. 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  He presents today for preoperative risk assessment for right hip replacement.  Recently he underwent coronary calcium scoring which showed a calcium score of 205, 77th percentile for age and sex matched controls.  This was done for screening purposes due to family history of heart disease including a father with MI and CABG at age 9 and ultimately who died at 89 of MI.  Additionally he has dyslipidemia with recent total cholesterol 231, triglyceride 96, HDL 61 and LDL 151.  He was placed on rosuvastatin 10 mg daily based on his elevated calcium score.  He denies any chest pain or worsening shortness of breath with exertion and has been physically active most of his life particularly in the [employer]  07/24/2022  Dakota Blair returns today for follow-up.  He is doing very well on rosuvastatin.  He denies any significant side effects.  His most recent lipids are excellent.  Particle numbers are now 576 for LDL, LDL cholesterol of 66, HDL 74, triglycerides 64 and small LDL particle #380.  This represents additional improvement since last year.  He has been more active since he had the right hip replacement.  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   Depression    Hx of adenomatous polyp of colon 05/2021   58m adenoma repeat exam 2029-32    Past Surgical History:  Procedure Laterality Date   COLONOSCOPY  04/25/2011   Normal   moles removed     ROTATOR  CUFF REPAIR Right    TENDON RELEASE Right    right thumb   TOOTH EXTRACTION     with implant and small bone graft   WISDOM TOOTH EXTRACTION      FAMHx:  Family History  Problem Relation Age of Onset   Pancreatic cancer Mother    Heart disease Father    Colon cancer Neg Hx    Colon polyps Neg Hx    Esophageal cancer Neg Hx    Rectal cancer Neg Hx    Stomach cancer Neg Hx     SOCHx:   reports that he has never smoked. He has never used smokeless tobacco. He reports current alcohol use of about 5.0 standard drinks of alcohol per week. He reports that he does not use drugs.  ALLERGIES:  Allergies  Allergen Reactions   Penicillins Rash    ROS: Pertinent items noted in HPI and remainder of comprehensive ROS otherwise negative.  HOME MEDS: Current Outpatient Medications on File Prior to Visit  Medication Sig Dispense Refill   DULoxetine (CYMBALTA) 60 MG capsule Take 60 mg by mouth daily.       loratadine (CLARITIN) 10 MG tablet Take by mouth.     Multiple Vitamins-Minerals (CENTRUM SILVER ULTRA MENS PO) Take by mouth daily.     rosuvastatin (CRESTOR) 20 MG tablet Take 1 tablet (20 mg total) by mouth daily.  90 tablet 3   No current facility-administered medications on file prior to visit.    LABS/IMAGING: No results found for this or any previous visit (from the past 48 hour(s)). No results found.  LIPID PANEL: No results found for: "CHOL", "TRIG", "HDL", "CHOLHDL", "VLDL", "LDLCALC", "LDLDIRECT"  WEIGHTS: Wt Readings from Last 3 Encounters:  07/24/22 151 lb (68.5 kg)  01/27/22 148 lb (67.1 kg)  10/06/21 151 lb 6.4 oz (68.7 kg)    VITALS: BP 122/68   Pulse 78   Ht '5\' 4"'$  (1.626 m)   Wt 151 lb (68.5 kg)   SpO2 98%   BMI 25.92 kg/m   EXAM: Deferred  EKG: Deferred  ASSESSMENT: Abnormal CAC score of 205, 77th percentile (08/2021) Dyslipidemia, goal LDL less than 70 Family history of premature coronary disease in his father Negative LP(a)  PLAN: 1.    Dakota Blair continues to do well with marked reduction in his lipids on high intensity rosuvastatin therapy.  He is tolerating it well.  He is at target across-the-board with his cholesterol.  Blood pressure is well controlled.  He is active and his weight is well controlled.  Overall I think he is doing very well.  He would like to follow-up annually or sooner as necessary with me.  Dakota Casino, MD, [facility], [place] Director of the Advanced Lipid Disorders &  Cardiovascular Risk Reduction Clinic Diplomate of the [employer] Board of Clinical Lipidology Attending Cardiologist  Direct Dial: [phone]  Fax: [phone]  Website:  [email or web address] Dakota Blair 07/24/2022, 8:05 AM

## 2022-07-24 NOTE — Patient Instructions (Signed)
Medication Instructions:  Your physician recommends that you continue on your current medications as directed. Please refer to the Current Medication list given to you today.  *If you need a refill on your cardiac medications before your next appointment, please call your pharmacy*  Lab Work: NONE ordered at this time of appointment   If you have labs (blood work) drawn today and your tests are completely normal, you will receive your results only by: [place] (if you have MyChart) OR A paper copy in the mail If you have any lab test that is abnormal or we need to change your treatment, we will call you to review the results.  Testing/Procedures: NONE ordered at this time of appointment   Follow-Up: At [facility], you and your health needs are our priority.  As part of our continuing mission to provide you with exceptional heart care, we have created designated Provider Care Teams.  These Care Teams include your primary Cardiologist (physician) and Advanced Practice Providers (APPs -  Physician Assistants and Nurse Practitioners) who all work together to provide you with the care you need, when you need it.  Your next appointment:   1 year(s)  The format for your next appointment:   In Person  Provider:   Pixie Casino, MD     Other Instructions   Important Information About [facility]

## 2022-07-25 DIAGNOSIS — Z1331 Encounter for screening for depression: Secondary | ICD-10-CM | POA: Diagnosis not present

## 2022-07-25 DIAGNOSIS — Z125 Encounter for screening for malignant neoplasm of prostate: Secondary | ICD-10-CM | POA: Diagnosis not present

## 2022-07-25 DIAGNOSIS — Z0000 Encounter for general adult medical examination without abnormal findings: Secondary | ICD-10-CM | POA: Diagnosis not present

## 2022-07-25 DIAGNOSIS — Z1339 Encounter for screening examination for other mental health and behavioral disorders: Secondary | ICD-10-CM | POA: Diagnosis not present

## 2022-07-26 NOTE — Addendum Note (Signed)
Addended by: Fidel Levy on: 07/26/2022 02:01 PM   Modules accepted: Orders

## 2022-10-11 ENCOUNTER — Other Ambulatory Visit: Payer: Self-pay | Admitting: Internal Medicine

## 2022-11-10 DIAGNOSIS — Z96641 Presence of right artificial hip joint: Secondary | ICD-10-CM | POA: Diagnosis not present

## 2023-01-18 ENCOUNTER — Ambulatory Visit (INDEPENDENT_AMBULATORY_CARE_PROVIDER_SITE_OTHER): Payer: Federal, State, Local not specified - PPO | Admitting: Dermatology

## 2023-01-18 ENCOUNTER — Encounter: Payer: Self-pay | Admitting: Dermatology

## 2023-01-18 VITALS — BP 129/83

## 2023-01-18 DIAGNOSIS — W908XXA Exposure to other nonionizing radiation, initial encounter: Secondary | ICD-10-CM

## 2023-01-18 DIAGNOSIS — L570 Actinic keratosis: Secondary | ICD-10-CM | POA: Diagnosis not present

## 2023-01-18 DIAGNOSIS — Z85828 Personal history of other malignant neoplasm of skin: Secondary | ICD-10-CM

## 2023-01-18 DIAGNOSIS — L578 Other skin changes due to chronic exposure to nonionizing radiation: Secondary | ICD-10-CM

## 2023-01-18 NOTE — Progress Notes (Signed)
   New Patient Visit   Subjective  Dakota Blair is a 62 y.o. 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 He says he was treated by Dr. Tori Milks. It was probably removed with the biopsy and cauterized. He wants his face checked. He has not had a full skin exam.There is a small lesion on his left temple x 3-4 months which is a Scaly irritated lesion.   The following portions of the chart were reviewed this encounter and updated as appropriate: medications, allergies, medical history  Review of Systems:  No other skin or systemic complaints except as noted in HPI or Assessment and Plan.  Objective  Well appearing patient in no apparent distress; mood and affect are within normal limits.   A focused examination was performed of the following areas: face  Relevant exam findings are noted in the Assessment and Plan.    Assessment & Plan   ACTINIC KERATOSIS Exam: Erythematous thin papules/macules with gritty scale  Actinic keratoses are precancerous spots that appear secondary to cumulative UV radiation exposure/sun exposure over time. They are chronic with expected duration over 1 year. A portion of actinic keratoses will progress to squamous cell carcinoma of the skin. It is not possible to reliably predict which spots will progress to skin cancer and so treatment is recommended to prevent development of skin cancer.  Recommend daily broad spectrum sunscreen SPF 30+ to sun-exposed areas, reapply every 2 hours as needed.  Recommend staying in the shade or wearing long sleeves, sun glasses (UVA+UVB protection) and wide brim hats (4-inch brim around the entire circumference of the hat). Call for new or changing lesions.  Treatment Plan:  Prior to procedure, discussed risks of blister formation, small wound, skin dyspigmentation, or rare scar following cryotherapy. Recommend Vaseline ointment to treated  areas while healing.  Destruction Procedure Note Destruction method: cryotherapy   Informed consent: discussed and consent obtained   Lesion destroyed using liquid nitrogen: Yes   Outcome: patient tolerated procedure well with no complications   Post-procedure details: wound care instructions given   Locations: right temple, left temple # of Lesions Treated: 3       Return if symptoms worsen or fail to improve, for AK Follow Up, TBSE.  Jaclynn Guarneri, CMA, am acting as scribe for Langston Reusing, MD.   Documentation: I have reviewed the above documentation for accuracy and completeness, and I agree with the above.  Langston Reusing, MD

## 2023-01-18 NOTE — Patient Instructions (Addendum)
Due to recent changes in healthcare laws, you may see results of your pathology and/or laboratory studies on MyChart before the doctors have had a chance to review them. We understand that in some cases there may be results that are confusing or concerning to you. Please understand that not all results are received at the same time and often the doctors may need to interpret multiple results in order to provide you with the best plan of care or course of treatment. Therefore, we ask that you please give [place] 2 business days to thoroughly review all your results before contacting the office for clarification. Should we see a critical lab result, you will be contacted sooner.   If You Need Anything After Your Visit  If you have any questions or concerns for your doctor, please call our main line at [phone] If no one answers, please leave a voicemail as directed and we will return your call as soon as possible. Messages left after 4 pm will be answered the following business day.   You may also send [place] a message via [place]. We typically respond to MyChart messages within 1-2 business days.  For prescription refills, please ask your pharmacy to contact our office. Our fax number is [phone].  If you have an urgent issue when the clinic is closed that cannot wait until the next business day, you can page your doctor at the number below.    Please note that while we do our best to be available for urgent issues outside of office hours, we are not available 24/7.   If you have an urgent issue and are unable to reach [place], you may choose to seek medical care at your doctor's office, retail clinic, urgent care center, or emergency room.  If you have a medical emergency, please immediately call 911 or go to the emergency department. In the event of inclement weather, please call our main line at [phone] for an update on the status of any delays or closures.  Dermatology Medication Tips: Please  keep the boxes that topical medications come in in order to help keep track of the instructions about where and how to use these. Pharmacies typically print the medication instructions only on the boxes and not directly on the medication tubes.   If your medication is too expensive, please contact our office at [phone] or send [place] a message through [place].   We are unable to tell what your co-pay for medications will be in advance as this is different depending on your insurance coverage. However, we may be able to find a substitute medication at lower cost or fill out paperwork to get insurance to cover a needed medication.   If a prior authorization is required to get your medication covered by your insurance company, please allow [place] 1-2 business days to complete this process.  Drug prices often vary depending on where the prescription is filled and some pharmacies may offer cheaper prices.  The website www.goodrx.com contains coupons for medications through different pharmacies. The prices here do not account for what the cost may be with help from insurance (it may be cheaper with your insurance), but the website can give you the price if you did not use any insurance.  - You can print the associated coupon and take it with your prescription to the pharmacy.  - You may also stop by our office during regular business hours and pick up a GoodRx coupon card.  - If you need your  prescription sent electronically to a different pharmacy, notify our office through [facility] or by phone at [phone]    Cryotherapy Aftercare  Wash gently with soap and water everyday.   Apply Vaseline and Band-Aid daily until healed. Cryotherapy Aftercare  Wash gently with soap and water everyday.   Apply Vaseline and Band-Aid daily until healed. I counseled the patient regarding the following: Skin Care: Actinic Damage can improve with broad spectrum sunscreen, sun avoidance, bleaching creams,  retinoids, chemical peels and laser. Expectations: Actinic Damage is photo-aging from excessive sun exposure. It manifests as unwanted pigmentation, wrinkles and textural thinning of the skin.  I recommended the following: Broad Spectrum Sunscreen SPF 30+ - SPF 30 daily to face, neck, chest and hands, reapplying every 3 hours when outside for long periods of time

## 2023-04-15 IMAGING — CT CT CARDIAC CORONARY ARTERY CALCIUM SCORE
3 series · 13 of 20 positions shown, 15 images · non-contrast
Comparison: None.

CLINICAL DATA: 60-year-old Caucasian male with history of
hyperlipidemia and family history of heart disease.

EXAM:
CT CARDIAC CORONARY ARTERY CALCIUM SCORE
TECHNIQUE: Non-contrast imaging through the heart was performed using
prospective ECG gating. Image post processing was performed on an
independent workstation, allowing for quantitative analysis of the
heart and coronary arteries. Note that this exam targets the heart
and the chest was not imaged in its entirety.

[Series 2: calcium scoring 2.00 qr36 bestdiast 70% hrt calciu · axial · 0.37mm/px · z∈[+1738,+1794]mm · 3 of 70 slices shown]
[im 14/70  vessel]
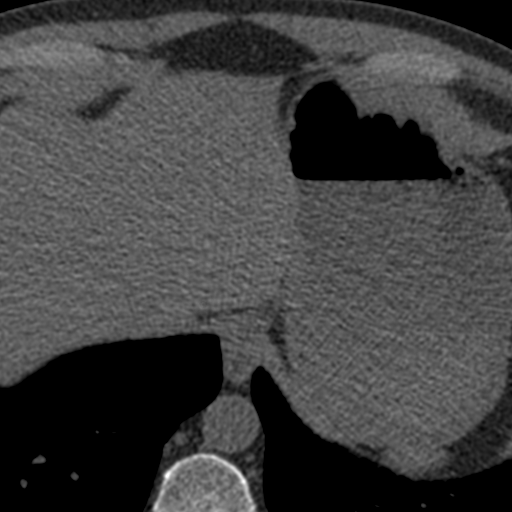
[im 28/70  vessel]
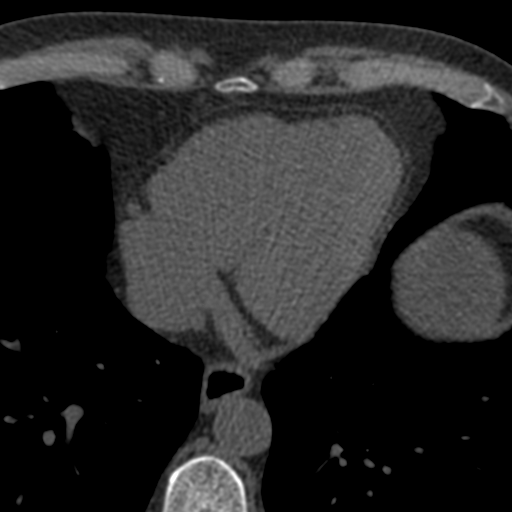
[im 42/70  vessel]
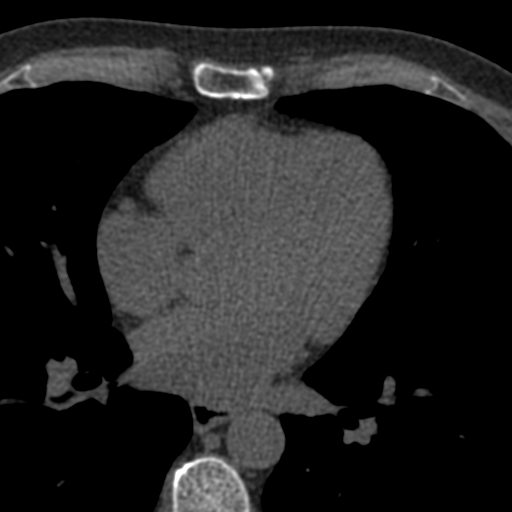

[Series 3: calcium scoring 2.00 br40 bestdiast 70% axial · axial · 0.57mm/px · z∈[+1734,+1826]mm · 5 of 70 slices shown, 7 images]
[im 12/70  vessel]
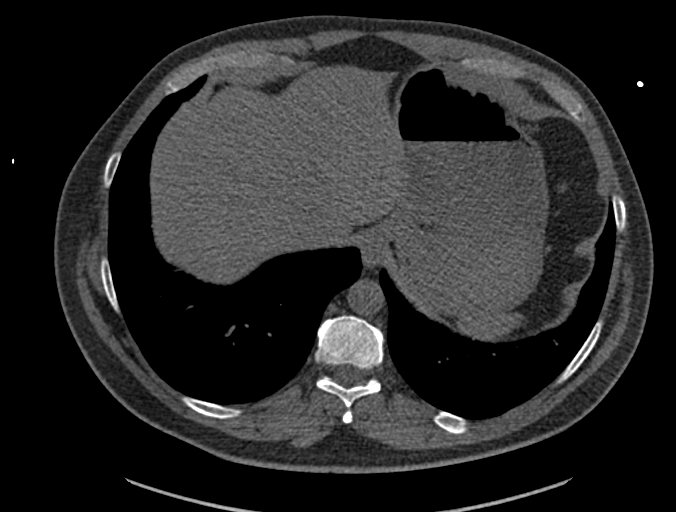
[im 12/70  lung]
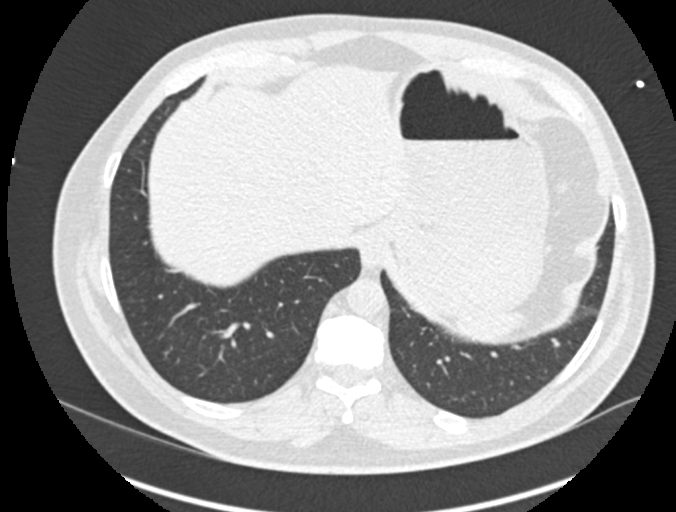
[im 24/70  vessel]
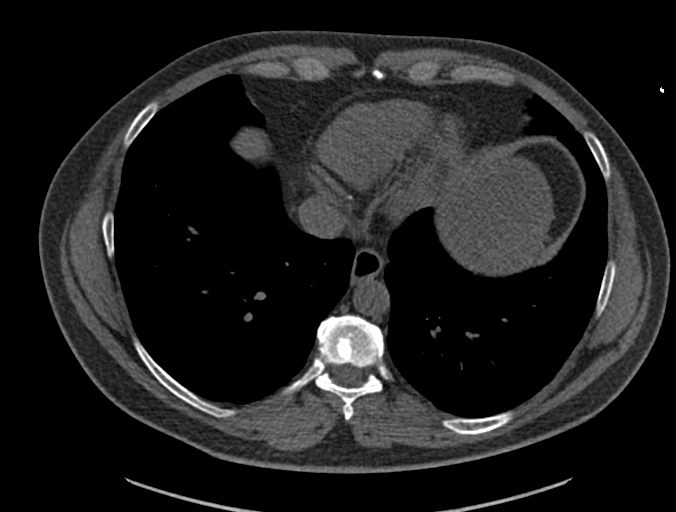
[im 35/70  vessel]
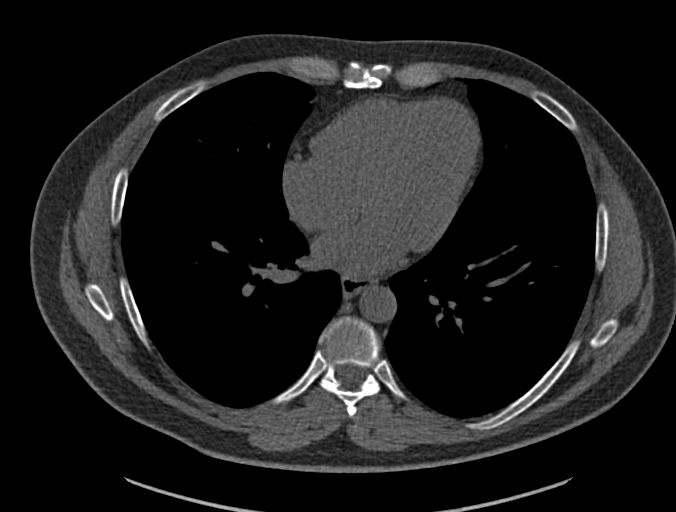
[im 47/70  vessel]
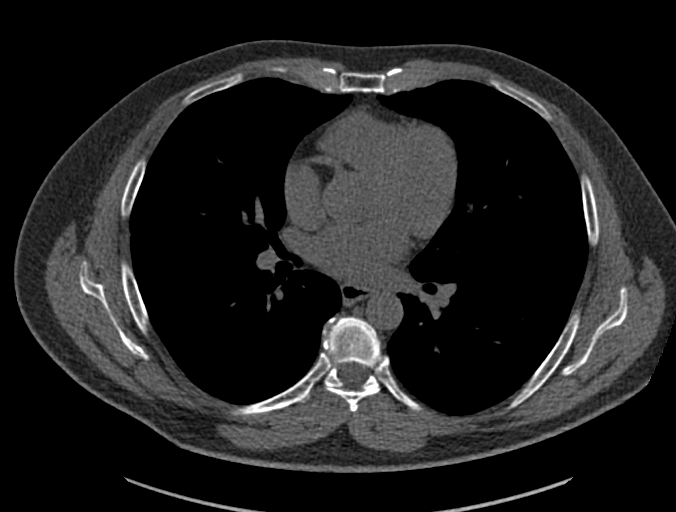
[im 58/70  vessel]
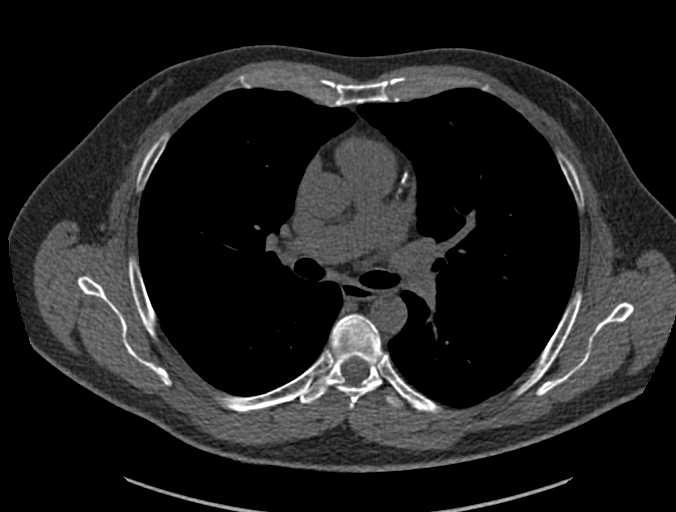
[im 58/70  lung]
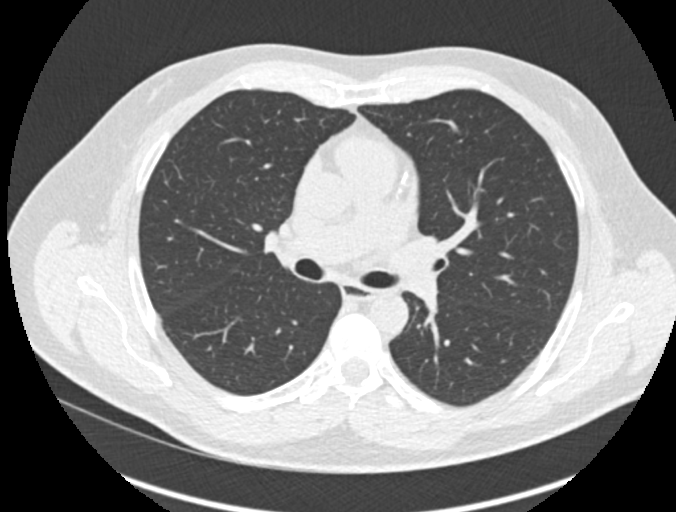

[Series 9: calcium scoring 2.00 br60 bestdiast 70% lungs · axial · 0.57mm/px · z∈[+1734,+1826]mm · 5 of 70 slices shown]
[im 12/70  vessel]
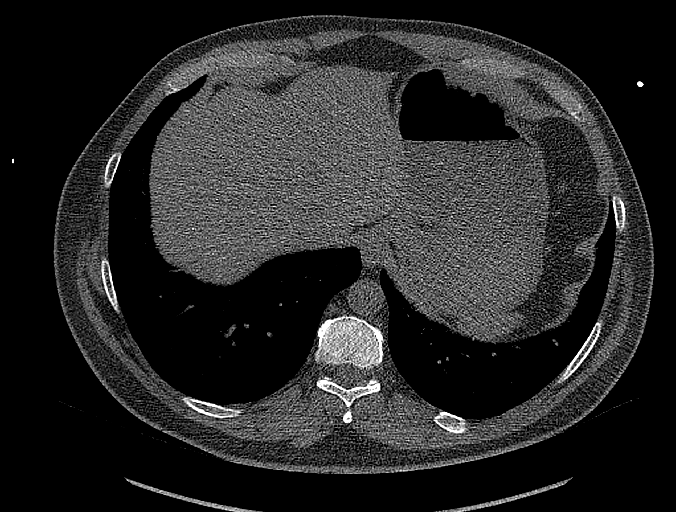
[im 24/70  vessel]
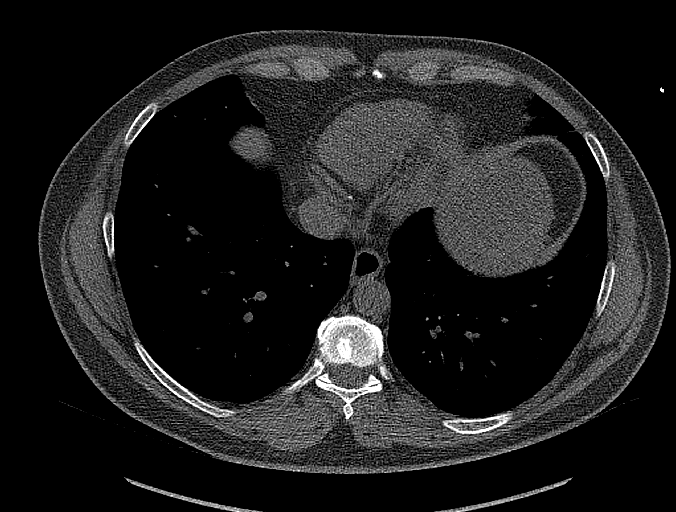
[im 35/70  vessel]
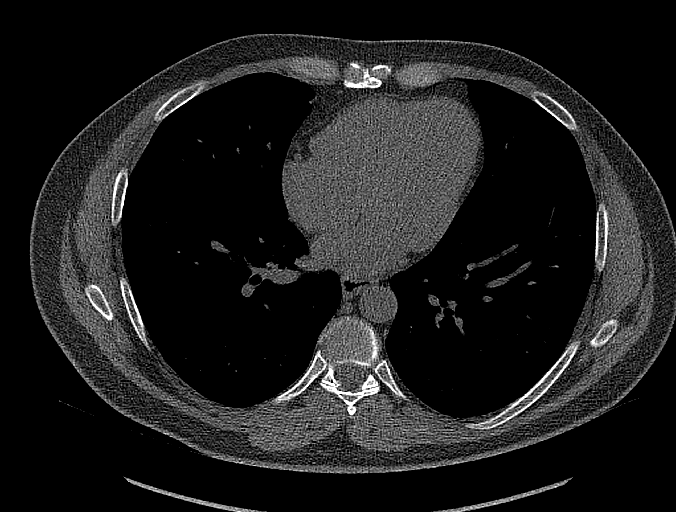
[im 47/70  vessel]
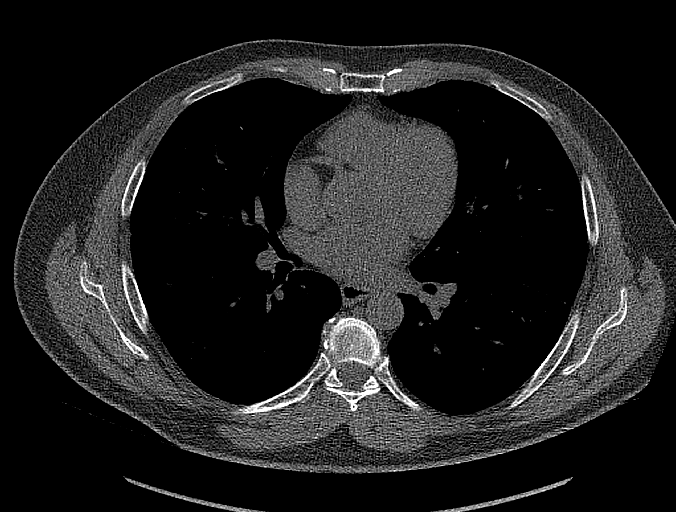
[im 58/70  vessel]
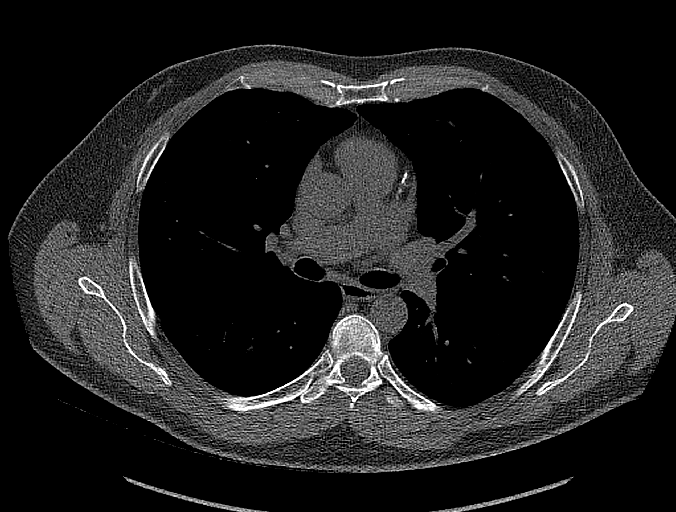

[13 of 20 positions shown; findings below may reference images not displayed]

FINDINGS: CORONARY CALCIUM SCORES:

Left Main: 0

LAD: 182

LCx: 12

RCA: 11

Total Agatston Score: 205

[HOSPITAL] percentile: 77

AORTA MEASUREMENTS:

Ascending Aorta: 26 mm

Descending Aorta: 21 mm

OTHER FINDINGS:

The heart size is within normal limits. No pericardial fluid is
identified. Visualized segments of the thoracic aorta and central
pulmonary arteries are normal in caliber. Visualized mediastinum and
hilar regions demonstrate no lymphadenopathy or masses. Visualized
lungs show no evidence of pulmonary edema, consolidation,
pneumothorax, nodule or pleural fluid. Visualized upper abdomen and
bony structures are unremarkable.
IMPRESSION: Coronary calcium score 205 is at the 77th percentile for the
patient's age, sex and race.

## 2023-05-28 ENCOUNTER — Other Ambulatory Visit: Payer: Self-pay | Admitting: *Deleted

## 2023-05-28 DIAGNOSIS — E785 Hyperlipidemia, unspecified: Secondary | ICD-10-CM

## 2023-05-28 DIAGNOSIS — I2510 Atherosclerotic heart disease of native coronary artery without angina pectoris: Secondary | ICD-10-CM

## 2023-05-28 DIAGNOSIS — Z8249 Family history of ischemic heart disease and other diseases of the circulatory system: Secondary | ICD-10-CM

## 2023-06-05 ENCOUNTER — Other Ambulatory Visit (HOSPITAL_BASED_OUTPATIENT_CLINIC_OR_DEPARTMENT_OTHER): Payer: Self-pay

## 2023-06-05 MED ORDER — FLULAVAL 0.5 ML IM SUSY
PREFILLED_SYRINGE | Freq: Once | INTRAMUSCULAR | 0 refills | Status: AC
  Filled 2023-06-05: qty 0.5, 1d supply, fill #0

## 2023-06-05 MED ORDER — COVID-19 MRNA VAC-TRIS(PFIZER) 30 MCG/0.3ML IM SUSY
0.3000 mL | PREFILLED_SYRINGE | Freq: Once | INTRAMUSCULAR | 0 refills | Status: AC
  Filled 2023-06-05: qty 0.3, 1d supply, fill #0

## 2023-07-04 ENCOUNTER — Encounter: Payer: Self-pay | Admitting: Dermatology

## 2023-07-04 ENCOUNTER — Ambulatory Visit: Payer: Federal, State, Local not specified - PPO | Admitting: Dermatology

## 2023-07-04 DIAGNOSIS — D229 Melanocytic nevi, unspecified: Secondary | ICD-10-CM

## 2023-07-04 DIAGNOSIS — B079 Viral wart, unspecified: Secondary | ICD-10-CM | POA: Diagnosis not present

## 2023-07-04 DIAGNOSIS — Z1283 Encounter for screening for malignant neoplasm of skin: Secondary | ICD-10-CM | POA: Diagnosis not present

## 2023-07-04 DIAGNOSIS — W908XXA Exposure to other nonionizing radiation, initial encounter: Secondary | ICD-10-CM

## 2023-07-04 DIAGNOSIS — L578 Other skin changes due to chronic exposure to nonionizing radiation: Secondary | ICD-10-CM

## 2023-07-04 DIAGNOSIS — D1801 Hemangioma of skin and subcutaneous tissue: Secondary | ICD-10-CM

## 2023-07-04 DIAGNOSIS — L821 Other seborrheic keratosis: Secondary | ICD-10-CM

## 2023-07-04 DIAGNOSIS — L814 Other melanin hyperpigmentation: Secondary | ICD-10-CM | POA: Diagnosis not present

## 2023-07-04 DIAGNOSIS — L570 Actinic keratosis: Secondary | ICD-10-CM

## 2023-07-04 MED ORDER — IMIQUIMOD 5 % EX CREA: TOPICAL_CREAM | Freq: Every day | CUTANEOUS | 3 refills | Status: DC

## 2023-07-04 NOTE — Progress Notes (Signed)
   Follow-Up Visit   Subjective  Dakota Blair is a 62 y.o. male who presents for the following: Skin Cancer Screening and Full Body Skin Exam  The patient presents for Total-Body Skin Exam (TBSE) for skin cancer screening and mole check. The patient has spots, moles and lesions to be evaluated, some may be new or changing and the patient may have concern these could be cancer.    The following portions of the chart were reviewed this encounter and updated as appropriate: medications, allergies, medical history  Review of Systems:  No other skin or systemic complaints except as noted in HPI or Assessment and Plan.  Objective  Well appearing patient in no apparent distress; mood and affect are within normal limits.  A full examination was performed including scalp, head, eyes, ears, nose, lips, neck, chest, axillae, abdomen, back, buttocks, bilateral upper extremities, bilateral lower extremities, hands, feet, fingers, toes, fingernails, and toenails. All findings within normal limits unless otherwise noted below.   Relevant physical exam findings are noted in the Assessment and Plan.  Left cheek (2) Erythematous thin papules/macules with gritty scale.   Left Hand - Posterior Verrucous papules        Assessment & Plan   SKIN CANCER SCREENING PERFORMED TODAY.  Mild ACTINIC DAMAGE - Chronic condition, secondary to cumulative UV/sun exposure - Recommend daily broad spectrum sunscreen SPF 30+ to sun-exposed areas, reapply every 2 hours as needed.  - Staying in the shade or wearing long sleeves, sun glasses (UVA+UVB protection) and wide brim hats (4-inch brim around the entire circumference of the hat) are also recommended for sun protection.  - Call for new or changing lesions.  LENTIGINES, SEBORRHEIC KERATOSES, HEMANGIOMAS - Benign normal skin lesions - Benign-appearing - Call for any changes  MELANOCYTIC NEVI - Tan-brown and/or pink-flesh-colored symmetric macules and  papules - Benign appearing on exam today - Observation - Call clinic for new or changing moles - Recommend daily use of broad spectrum spf 30+ sunscreen to sun-exposed areas.   AK (actinic keratosis) (2) Left cheek  Destruction of lesion - Left cheek (2) Complexity: simple   Destruction method: cryotherapy   Informed consent: discussed and consent obtained   Timeout:  patient name, date of birth, surgical site, and procedure verified Lesion destroyed using liquid nitrogen: Yes   Region frozen until ice ball extended beyond lesion: Yes   Outcome: patient tolerated procedure well with no complications   Post-procedure details: wound care instructions given    Viral warts, unspecified type Left Hand - Posterior  Start Imiquimod cream every other night x 16 weeks. Apply Aquaphor on the nights not using Imiquimod.  Start Cimetidine 200 mg 1 po qd   Return in about 3 months (around 10/04/2023) for Wart Follow up.  I, Joanie Coddington, CMA, am acting as scribe for [employer], DO .   Documentation: I have reviewed the above documentation for accuracy and completeness, and I agree with the above.  Langston Reusing, DO

## 2023-07-04 NOTE — Patient Instructions (Addendum)
Cryotherapy Aftercare  Wash gently with soap and water everyday.   Apply Vaseline and Band-Aid daily until healed.      Start Imiquimod cream every other night x 16 weeks. Apply Aquaphor on the nights not using Imiquimod.  Important Information  Due to recent changes in healthcare laws, you may see results of your pathology and/or laboratory studies on MyChart before the doctors have had a chance to review them. We understand that in some cases there may be results that are confusing or concerning to you. Please understand that not all results are received at the same time and often the doctors may need to interpret multiple results in order to provide you with the best plan of care or course of treatment. Therefore, we ask that you please give [place] 2 business days to thoroughly review all your results before contacting the office for clarification. Should we see a critical lab result, you will be contacted sooner.   If You Need Anything After Your Visit  If you have any questions or concerns for your doctor, please call our main line at [phone] If no one answers, please leave a voicemail as directed and we will return your call as soon as possible. Messages left after 4 pm will be answered the following business day.   You may also send [place] a message via MyChart. We typically respond to MyChart messages within 1-2 business days.  For prescription refills, please ask your pharmacy to contact our office. Our fax number is [phone].  If you have an urgent issue when the clinic is closed that cannot wait until the next business day, you can page your doctor at the number below.    Please note that while we do our best to be available for urgent issues outside of office hours, we are not available 24/7.   If you have an urgent issue and are unable to reach [place], you may choose to seek medical care at your doctor's office, retail clinic, urgent care center, or emergency room.  If you have  a medical emergency, please immediately call 911 or go to the emergency department. In the event of inclement weather, please call our main line at [phone] for an update on the status of any delays or closures.  Dermatology Medication Tips: Please keep the boxes that topical medications come in in order to help keep track of the instructions about where and how to use these. Pharmacies typically print the medication instructions only on the boxes and not directly on the medication tubes.   If your medication is too expensive, please contact our office at [phone] or send [place] a message through MyChart.   We are unable to tell what your co-pay for medications will be in advance as this is different depending on your insurance coverage. However, we may be able to find a substitute medication at lower cost or fill out paperwork to get insurance to cover a needed medication.   If a prior authorization is required to get your medication covered by your insurance company, please allow [place] 1-2 business days to complete this process.  Drug prices often vary depending on where the prescription is filled and some pharmacies may offer cheaper prices.  The website www.goodrx.com contains coupons for medications through different pharmacies. The prices here do not account for what the cost may be with help from insurance (it may be cheaper with your insurance), but the website can give you the price if you did not use  any insurance.  - You can print the associated coupon and take it with your prescription to the pharmacy.  - You may also stop by our office during regular business hours and pick up a GoodRx coupon card.  - If you need your prescription sent electronically to a different pharmacy, notify our office through [facility] or by phone at [phone]

## 2023-07-16 DIAGNOSIS — Z8249 Family history of ischemic heart disease and other diseases of the circulatory system: Secondary | ICD-10-CM | POA: Diagnosis not present

## 2023-07-16 DIAGNOSIS — E785 Hyperlipidemia, unspecified: Secondary | ICD-10-CM | POA: Diagnosis not present

## 2023-07-16 DIAGNOSIS — I2584 Coronary atherosclerosis due to calcified coronary lesion: Secondary | ICD-10-CM | POA: Diagnosis not present

## 2023-07-16 DIAGNOSIS — I2510 Atherosclerotic heart disease of native coronary artery without angina pectoris: Secondary | ICD-10-CM | POA: Diagnosis not present

## 2023-07-17 LAB — NMR, LIPOPROFILE
Cholesterol, Total: 163 mg/dL (ref 100–199)
HDL Particle Number: 46 umol/L (ref >=30.5)
HDL-C: 76 mg/dL (ref >39)
LDL Particle Number: 650 nmol/L (ref <1000)
LDL Size: 21.2 nmol (ref >20.5)
LDL-C (NIH Calc): 70 mg/dL (ref 0–99)
LP-IR Score: 35 (ref <=45)
Small LDL Particle Number: 245 nmol/L (ref <=527)
Triglycerides: 93 mg/dL (ref 0–149)

## 2023-07-21 NOTE — Progress Notes (Unsigned)
Cardiology Office Note:    Date:  07/25/2023   ID:  Dakota Blair, DOB 1961-03-20, MRN [id number]  PCP:  Melida Quitter, MD   [facility] Providers Cardiologist:  Chrystie Nose, MD Cardiology APP:  Marcelino Duster, PA     Referring MD: Melida Quitter, MD   Chief Complaint  Patient presents with   Follow-up    CAC, HLD    History of Present Illness:    Dakota Blair is a 62 y.o. male with a hx of HLD and elevated coronary calcium score. He was referred after coronary calcium score returned at 205 placing him at the 77th percentile in 2022. He is maintained on 20 mg crestor. Last seen by Dr Rennis Golden 07/2022.  Previously in [employer] in [employer], also Armed forces technical officer.    He presents today for annual follow up. NMR profile looks well controlled, no changes in medications recommended. He remains active exercising 4-5 times per week - elliptical and walking. He is a nonsmoker.    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   DULoxetine (CYMBALTA) 60 MG capsule Take 60 mg by mouth daily.     imiquimod (ALDARA) 5 % cream Apply topically at bedtime. Apply to affected area of hand every other night x 16 weeks   loratadine (CLARITIN) 10 MG tablet Take by mouth.   Multiple Vitamins-Minerals (CENTRUM SILVER ULTRA MENS PO) Take by mouth daily.    rosuvastatin (CRESTOR) 20 MG tablet TAKE 1 TABLET BY MOUTH EVERY DAY     Allergies:   Penicillins   Social History   Socioeconomic History   Marital status: Married    Spouse name: Not on file   Number of children: Not on file   Years of education: Not on file   Highest education level: Not on file  Occupational History   Not on file  Tobacco Use   Smoking status: Never   Smokeless tobacco: Never  Substance and Sexual Activity   Alcohol use: Yes    Alcohol/week: 5.0 standard drinks of alcohol    Types: 5 drink(s) per week    Comment: occ   Drug use: No   Sexual activity: Not on file  Other Topics Concern   Not on file  Social History Narrative   Not on file   Social Determinants of Health   Financial Resource Strain: Not on file  Food Insecurity: Not on file  Transportation Needs: Not on file  Physical Activity: Not on file  Stress: Not on file  Social Connections: Not on file     Family History: The patient's family history includes  Heart disease in his father; Pancreatic cancer in his mother. There is no history of Colon cancer, Colon polyps, Esophageal cancer, Rectal cancer, or Stomach cancer.  ROS:   Please see the history of present illness.     All other systems reviewed and are negative.  EKGs/Labs/Other Studies Reviewed:    The following studies were reviewed today:    EKG Interpretation Date/Time:  Wednesday July 25 2023 08:10:58 EST Ventricular Rate:  72 PR Interval:  138 QRS Duration:  70 QT Interval:  356 QTC Calculation: 389 R Axis:   31  Text Interpretation: Normal sinus rhythm Normal ECG No previous ECGs available Confirmed by Micah Flesher ([id number]) on 07/25/2023 8:15:48 AM    Recent Labs: No results found for requested labs within last 365 days.  Recent Lipid Panel No results found for: "CHOL", "TRIG", "HDL", "CHOLHDL", "VLDL", "LDLCALC", "LDLDIRECT"   Risk Assessment/Calculations:                Physical Exam:    VS:  BP  118/78   Pulse 72   Ht 5\' 4"  (1.626 m)   Wt 148 lb (67.1 kg)   SpO2 94%   BMI 25.40 kg/m     Wt Readings from Last 3 Encounters:  07/25/23 148 lb (67.1 kg)  07/24/22 151 lb (68.5 kg)  01/27/22 148 lb (67.1 kg)     GEN:  Well nourished, well developed in no acute distress HEENT: Normal NECK: No JVD; No carotid bruits LYMPHATICS: No lymphadenopathy CARDIAC: RRR, no murmurs, rubs, gallops RESPIRATORY:  Clear to auscultation without rales, wheezing or rhonchi  ABDOMEN: Soft, non-tender, non-distended MUSCULOSKELETAL:  No edema; No deformity  SKIN: Warm and dry NEUROLOGIC:  Alert and oriented x 3 PSYCHIATRIC:  Normal affect   ASSESSMENT:    1. Dyslipidemia, goal LDL below 70   2. Coronary artery calcification   3. Family history of heart disease    PLAN:    In order of problems listed above:  Hyperlipidemia with LDL goal < 70 20 mg crestor Reassuring recent NMR panel No changes   Coronary calcification By coronary calcium score - no chest pain - controlling risk factors well, remains active     Follow up in 1 year.      Medication Adjustments/Labs and Tests Ordered: Current medicines are reviewed at length with the patient today.  Concerns regarding medicines are outlined above.  Orders Placed This Encounter  Procedures   EKG 12-Lead   No orders of the defined types were placed in this encounter.   Patient Instructions  Medication Instructions:  Your physician recommends that you continue on your current medications as directed. Please refer to the Current Medication list given to you today.  *If you need a refill on your cardiac medications before your next appointment, please call your pharmacy*   Lab Work: NONE ordered at this time of appointment. Will repeat MMR labs in 1 year.    Testing/Procedures: NONE ordered at this time of appointment     Follow-Up: At [facility], you and your health needs are our priority.  As part of our  continuing mission to provide you with exceptional heart care, we have created designated Provider Care Teams.  These Care Teams include your primary Cardiologist (physician) and Advanced Practice Providers (APPs -  Physician Assistants and Nurse Practitioners) who all work together to provide you with the care you need, when you need it.  We recommend signing up for the patient portal called "MyChart".  Sign up information is provided on this After Visit Summary.  MyChart is used to connect with patients for Virtual Visits (Telemedicine).  Patients are able to view lab/test results, encounter notes, upcoming appointments, etc.  Non-urgent messages can be sent to your provider as well.   To learn more about what you can do with MyChart, go to [email or web address].    Your next appointment:   1 year(s)  Provider:   Chrystie Nose, MD  or Micah Flesher, PA-C           Signed, Roe Rutherford [place], [place]  07/25/2023 8:26 AM    [facility]

## 2023-07-25 ENCOUNTER — Encounter: Payer: Self-pay | Admitting: Physician Assistant

## 2023-07-25 ENCOUNTER — Ambulatory Visit: Payer: Federal, State, Local not specified - PPO | Attending: Physician Assistant | Admitting: Physician Assistant

## 2023-07-25 VITALS — BP 118/78 | HR 72 | Ht 64.0 in | Wt 148.0 lb

## 2023-07-25 DIAGNOSIS — I2510 Atherosclerotic heart disease of native coronary artery without angina pectoris: Secondary | ICD-10-CM

## 2023-07-25 DIAGNOSIS — E785 Hyperlipidemia, unspecified: Secondary | ICD-10-CM | POA: Diagnosis not present

## 2023-07-25 DIAGNOSIS — Z8249 Family history of ischemic heart disease and other diseases of the circulatory system: Secondary | ICD-10-CM

## 2023-07-25 MED ORDER — ROSUVASTATIN CALCIUM 20 MG PO TABS: 20.0000 mg | ORAL_TABLET | Freq: Every day | ORAL | 3 refills | Status: DC

## 2023-07-25 NOTE — Addendum Note (Signed)
Addended by: Lamar Benes on: 07/25/2023 08:35 AM   Modules accepted: Orders

## 2023-07-25 NOTE — Patient Instructions (Signed)
Medication Instructions:  Your physician recommends that you continue on your current medications as directed. Please refer to the Current Medication list given to you today.  *If you need a refill on your cardiac medications before your next appointment, please call your pharmacy*   Lab Work: NONE ordered at this time of appointment. Will repeat MMR labs in 1 year.    Testing/Procedures: NONE ordered at this time of appointment     Follow-Up: At [facility], you and your health needs are our priority.  As part of our continuing mission to provide you with exceptional heart care, we have created designated Provider Care Teams.  These Care Teams include your primary Cardiologist (physician) and Advanced Practice Providers (APPs -  Physician Assistants and Nurse Practitioners) who all work together to provide you with the care you need, when you need it.  We recommend signing up for the patient portal called "MyChart".  Sign up information is provided on this After Visit Summary.  MyChart is used to connect with patients for Virtual Visits (Telemedicine).  Patients are able to view lab/test results, encounter notes, upcoming appointments, etc.  Non-urgent messages can be sent to your provider as well.   To learn more about what you can do with MyChart, go to [email or web address].    Your next appointment:   1 year(s)  Provider:   Chrystie Nose, MD  or Micah Flesher, PA-C

## 2023-07-30 DIAGNOSIS — E785 Hyperlipidemia, unspecified: Secondary | ICD-10-CM | POA: Diagnosis not present

## 2023-08-03 DIAGNOSIS — Z1331 Encounter for screening for depression: Secondary | ICD-10-CM | POA: Diagnosis not present

## 2023-08-03 DIAGNOSIS — Z1339 Encounter for screening examination for other mental health and behavioral disorders: Secondary | ICD-10-CM | POA: Diagnosis not present

## 2023-08-03 DIAGNOSIS — Z0000 Encounter for general adult medical examination without abnormal findings: Secondary | ICD-10-CM | POA: Diagnosis not present

## 2023-08-09 DIAGNOSIS — Z125 Encounter for screening for malignant neoplasm of prostate: Secondary | ICD-10-CM | POA: Diagnosis not present

## 2023-10-08 ENCOUNTER — Encounter: Payer: Self-pay | Admitting: Dermatology

## 2023-10-08 ENCOUNTER — Ambulatory Visit (INDEPENDENT_AMBULATORY_CARE_PROVIDER_SITE_OTHER): Payer: Federal, State, Local not specified - PPO | Admitting: Dermatology

## 2023-10-08 VITALS — BP 134/89

## 2023-10-08 DIAGNOSIS — B078 Other viral warts: Secondary | ICD-10-CM

## 2023-10-08 DIAGNOSIS — B079 Viral wart, unspecified: Secondary | ICD-10-CM

## 2023-10-08 MED ORDER — IMIQUIMOD 5 % EX CREA: TOPICAL_CREAM | Freq: Every day | CUTANEOUS | 3 refills | Status: AC

## 2023-10-08 NOTE — Progress Notes (Signed)
   Follow-Up Visit   Subjective  Dakota Blair is a 63 y.o. male who presents for the following: wart f/u  Patient present today for follow up visit. Patient was last evaluated on 07/04/23. Patient reports sxs are better as he has been using the Imiquimod cream every other night for 16weeks and taking Cimetidine tab daily.  Patient denies medication changes.  The following portions of the chart were reviewed this encounter and updated as appropriate: medications, allergies, medical history  Review of Systems:  No other skin or systemic complaints except as noted in HPI or Assessment and Plan.  Objective  Well appearing patient in no apparent distress; mood and affect are within normal limits.   A focused examination was performed of the following areas: left hand   Relevant exam findings are noted in the Assessment and Plan.       Left Hand - Posterior (20) Verrucous papules   Assessment & Plan   Multiple Flat Warts on Left Hand Assessment: Patient presents with approximately 20 flat warts on the left hand, with one additional wart noted elsewhere. Previous treatment with imiquimod cream has resulted in some improvement, as the warts appear somewhat flatter and no significant blistering has been reported since the last visit.  Plan:   Continue daily Tagamet (cimetidine) for immune stimulation.   Apply cryotherapy with liquid nitrogen to affected areas.   Discontinue imiquimod for 5 days post-cryotherapy to allow for healing.   Apply Aquaphor to treated areas during the 5-day break from imiquimod.   Resume imiquimod cream after the 5-day break.   Issue a new prescription for imiquimod cream.   Schedule a follow-up appointment in 6-8 weeks to assess progress and consider potential repeat cryotherapy if necessary.  VIRAL WARTS, UNSPECIFIED TYPE (20) Left Hand - Posterior (20)  Destruction of lesion - Left Hand - Posterior (20) Complexity: simple   Destruction method:  cryotherapy   Informed consent: discussed and consent obtained   Timeout:  patient name, date of birth, surgical site, and procedure verified Lesion destroyed using liquid nitrogen: Yes   Region frozen until ice ball extended beyond lesion: Yes   Outcome: patient tolerated procedure well with no complications   Post-procedure details: wound care instructions given    Return in about 8 weeks (around 12/03/2023) for wart cryo & ok to double book per JD.    Documentation: I have reviewed the above documentation for accuracy and completeness, and I agree with the above.   I, Shirron Marcha Solders, CMA, am acting as scribe for [employer], DO.   Langston Reusing, DO

## 2023-10-08 NOTE — Patient Instructions (Addendum)

## 2023-11-28 ENCOUNTER — Ambulatory Visit (INDEPENDENT_AMBULATORY_CARE_PROVIDER_SITE_OTHER)

## 2023-11-28 ENCOUNTER — Encounter: Payer: Self-pay | Admitting: Podiatry

## 2023-11-28 ENCOUNTER — Ambulatory Visit: Admitting: Podiatry

## 2023-11-28 DIAGNOSIS — M79671 Pain in right foot: Secondary | ICD-10-CM

## 2023-11-28 DIAGNOSIS — M779 Enthesopathy, unspecified: Secondary | ICD-10-CM | POA: Diagnosis not present

## 2023-11-28 DIAGNOSIS — M6740 Ganglion, unspecified site: Secondary | ICD-10-CM

## 2023-11-28 DIAGNOSIS — D169 Benign neoplasm of bone and articular cartilage, unspecified: Secondary | ICD-10-CM

## 2023-11-28 MED ORDER — TRIAMCINOLONE ACETONIDE 10 MG/ML IJ SUSP
10.0000 mg | Freq: Once | INTRAMUSCULAR | Status: AC
  Administered 2023-11-28: 10 mg via INTRA_ARTICULAR

## 2023-11-28 NOTE — Progress Notes (Signed)
 Subjective:   Patient ID: Dakota Blair, male   DOB: 63 y.o.   MRN: [id number]   HPI Patient states he started develop discomfort on top of his right foot about 6 weeks ago and noticed that there was enlargement and then over the last few weeks it seems like it is grown somewhat.  It is sore with shoe gear and patient does not smoke likes to be active   Review of Systems  All other systems reviewed and are negative.       Objective:  Physical Exam Vitals and nursing note reviewed.  Constitutional:      Appearance: He is well-developed.  Pulmonary:     Effort: Pulmonary effort is normal.  Musculoskeletal:        General: Normal range of motion.  Skin:    General: Skin is warm.  Neurological:     Mental Status: He is alert.     Neurovascular status intact muscle strength found to be adequate range of motion adequate with enlargement around the first metatarsal cuneiform joint that is flattened but it may also be a cyst formation.  Good digital perfusion well-oriented x 3     Assessment:  Possibility for a ganglionic versus subtle bone spur formation versus a tendinous inflammation around the area     Plan:  H&P reviewed x-ray taken I did do a proximal nerve block I then aspirated the area I was not able to get out gelatinous fluid but still possible and I injected carefully the tendon first metatarsocuneiform joint 2 mg Dexasone Kenalog 5 mm Xylocaine applied compression and wear compression for several weeks.  Reappoint as symptoms indicate may require bone resection at 1 point future  X-rays indicate there may be slight enlargement around the joint but no obvious bone spur formation

## 2023-12-04 ENCOUNTER — Ambulatory Visit (INDEPENDENT_AMBULATORY_CARE_PROVIDER_SITE_OTHER): Payer: Federal, State, Local not specified - PPO | Admitting: Dermatology

## 2023-12-04 ENCOUNTER — Encounter: Payer: Self-pay | Admitting: Dermatology

## 2023-12-04 VITALS — BP 144/77

## 2023-12-04 DIAGNOSIS — D485 Neoplasm of uncertain behavior of skin: Secondary | ICD-10-CM

## 2023-12-04 DIAGNOSIS — D492 Neoplasm of unspecified behavior of bone, soft tissue, and skin: Secondary | ICD-10-CM

## 2023-12-04 DIAGNOSIS — B079 Viral wart, unspecified: Secondary | ICD-10-CM

## 2023-12-04 DIAGNOSIS — B078 Other viral warts: Secondary | ICD-10-CM | POA: Diagnosis not present

## 2023-12-04 NOTE — Patient Instructions (Signed)

## 2023-12-04 NOTE — Progress Notes (Unsigned)
   Follow-Up Visit   Subjective  Dakota Blair is a 63 y.o. male who presents for the following: Wart follow up of left hand treated with LN2 x 20. He is using imiquimod every other night and taking cimetidine daily. He thinks they are a little better.    The following portions of the chart were reviewed this encounter and updated as appropriate: medications, allergies, medical history  Review of Systems:  No other skin or systemic complaints except as noted in HPI or Assessment and Plan.  Objective  Well appearing patient in no apparent distress; mood and affect are within normal limits.  Areas Examined: Left hand  Relevant physical exam findings are noted in the Assessment and Plan.    Left hand Flat top verrucous papule   Assessment & Plan   NEOPLASM OF UNCERTAIN BEHAVIOR OF SKIN Left hand Skin / nail biopsy Type of biopsy: tangential   Informed consent: discussed and consent obtained   Timeout: patient name, date of birth, surgical site, and procedure verified   Procedure prep:  Patient was prepped and draped in usual sterile fashion Prep type:  Isopropyl alcohol Anesthesia: the lesion was anesthetized in a standard fashion   Anesthetic:  1% lidocaine w/ epinephrine 1-100,000 buffered w/ 8.4% NaHCO3 Instrument used: flexible razor blade   Hemostasis achieved with: pressure, aluminum chloride and electrodesiccation   Outcome: patient tolerated procedure well   Post-procedure details: sterile dressing applied and wound care instructions given   Dressing type: bandage and petrolatum   Specimen 1 - Surgical pathology Differential Diagnosis: R/O Viral wart  Check Margins: No If positive for wart virus, with plan to send G Pen to [facility] and follow up appointment for LN2.   Return pending biopsy results.  I, Joanie Coddington, CMA, am acting as scribe for [employer], DO .   Documentation: I have reviewed the above documentation for accuracy and completeness, and I  agree with the above.  Langston Reusing, DO

## 2023-12-06 LAB — SURGICAL PATHOLOGY

## 2023-12-10 ENCOUNTER — Encounter: Payer: Self-pay | Admitting: Dermatology

## 2024-05-31 ENCOUNTER — Other Ambulatory Visit (HOSPITAL_BASED_OUTPATIENT_CLINIC_OR_DEPARTMENT_OTHER): Payer: Self-pay

## 2024-05-31 MED ORDER — FLUZONE 0.5 ML IM SUSY
0.5000 mL | PREFILLED_SYRINGE | Freq: Once | INTRAMUSCULAR | 0 refills | Status: AC
  Filled 2024-05-31: qty 0.5, 1d supply, fill #0

## 2024-06-06 ENCOUNTER — Other Ambulatory Visit (HOSPITAL_BASED_OUTPATIENT_CLINIC_OR_DEPARTMENT_OTHER): Payer: Self-pay

## 2024-06-06 MED ORDER — COMIRNATY 30 MCG/0.3ML IM SUSY
0.3000 mL | PREFILLED_SYRINGE | Freq: Once | INTRAMUSCULAR | 0 refills | Status: AC
  Filled 2024-06-06: qty 0.3, 1d supply, fill #0

## 2024-07-10 ENCOUNTER — Telehealth: Payer: Self-pay | Admitting: Physician Assistant

## 2024-07-10 ENCOUNTER — Encounter: Payer: Self-pay | Admitting: Physician Assistant

## 2024-07-10 NOTE — Telephone Encounter (Signed)
 Pt wants to know if he needs labs done before his upcoming appt on 08/01/24. Please advise

## 2024-07-10 NOTE — Telephone Encounter (Signed)
  error

## 2024-07-10 NOTE — Telephone Encounter (Signed)
 Spoke with pt regarding labs needed prior to office visit on 11/14. Wil forward to provider to advise.

## 2024-07-14 NOTE — Telephone Encounter (Signed)
 Spoke with pt. Pt is aware that as of now, there are no active lab orders. Pt's apt is at 8:00 AM on 11/14 and if pt would like, he could coming in fasting just incase labs are needed. Pt is aware that if Jon Hails PA decides to order any labs prior to that appointment, he will be notified.

## 2024-07-21 ENCOUNTER — Other Ambulatory Visit: Payer: Self-pay

## 2024-07-21 ENCOUNTER — Telehealth: Payer: Self-pay

## 2024-07-21 DIAGNOSIS — E785 Hyperlipidemia, unspecified: Secondary | ICD-10-CM

## 2024-07-21 NOTE — Telephone Encounter (Signed)
 Spoke with pt about lab work needed for November 2025 apt. NMR lab order placed. Pt will complete lab work prior to apt or the morning of his appointment with Jon Hails PA.

## 2024-07-23 DIAGNOSIS — H59811 Chorioretinal scars after surgery for detachment, right eye: Secondary | ICD-10-CM | POA: Diagnosis not present

## 2024-07-23 DIAGNOSIS — H35372 Puckering of macula, left eye: Secondary | ICD-10-CM | POA: Diagnosis not present

## 2024-07-23 DIAGNOSIS — H2513 Age-related nuclear cataract, bilateral: Secondary | ICD-10-CM | POA: Diagnosis not present

## 2024-07-25 ENCOUNTER — Ambulatory Visit: Admitting: Physician Assistant

## 2024-07-28 DIAGNOSIS — E785 Hyperlipidemia, unspecified: Secondary | ICD-10-CM | POA: Diagnosis not present

## 2024-07-29 LAB — NMR, LIPOPROFILE
Cholesterol, Total: 163 mg/dL (ref 100–199)
HDL Particle Number: 47.7 umol/L (ref >=30.5)
HDL-C: 79 mg/dL (ref >39)
LDL Particle Number: 670 nmol/L (ref <1000)
LDL Size: 20.3 nm — ABNORMAL LOW (ref >20.5)
LDL-C (NIH Calc): 64 mg/dL (ref 0–99)
LP-IR Score: 44 (ref <=45)
Small LDL Particle Number: 420 nmol/L (ref <=527)
Triglycerides: 116 mg/dL (ref 0–149)

## 2024-07-30 NOTE — Progress Notes (Unsigned)
 Cardiology Office Note:    Date:  08/01/2024   ID:  Dakota Blair, DOB 12-12-1960, MRN [id number]  PCP:  Stephane Leita DEL, MD   [facility] Providers Cardiologist:  Vinie JAYSON Maxcy, MD Cardiology APP:  Madie Jon Garre, PA     Referring MD: Stephane Leita DEL, MD   Chief Complaint  Patient presents with   Follow-up    HLD    History of Present Illness:    Dakota Blair is a 63 y.o. male with a hx of HLD and elevated coronary calcium  score. He was referred after coronary calcium  score returned at 205 placing him at the 77th percentile in 2022. He is maintained on 20 mg crestor . Last seen by Dr Maxcy 07/2022.  Previously in [employer] in [employer], also armed forces technical officer.   On prior visits he remains active with exercising 4-5 times per week.  He presents for annual follow up. Overall he is doing very well. He just re-signed for a 4 year term. He remains active with no cardiac complaints.    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 (Patient taking differently: Take 200 mg by mouth daily as needed.)   DULoxetine (CYMBALTA) 60 MG capsule Take 60 mg by mouth daily.     loratadine (CLARITIN) 10 MG tablet Take by mouth.   Multiple Vitamins-Minerals (CENTRUM SILVER ULTRA MENS PO) Take by mouth daily.   rosuvastatin  (CRESTOR ) 20 MG tablet Take 1 tablet  (20 mg total) by mouth daily.     Allergies:   Penicillins   Social History   Socioeconomic History   Marital status: Married    Spouse name: Not on file   Number of children: Not on file   Years of education: Not on file   Highest education level: Not on file  Occupational History   Not on file  Tobacco Use   Smoking status: Never   Smokeless tobacco: Never  Substance and Sexual Activity   Alcohol  use: Yes    Alcohol /week: 5.0 standard drinks of alcohol     Types: 5 drink(s) per week    Comment: occ   Drug use: No   Sexual activity: Not on file  Other Topics Concern   Not on file  Social History Narrative   Not on file   Social Drivers of Health   Financial Resource Strain: Not on file  Food Insecurity: Not on file  Transportation Needs: Not on file  Physical Activity: Not on file  Stress: Not on file  Social Connections: Not on file     Family History: The patient's family history includes Heart disease in his father;  Pancreatic cancer in his mother. There is no history of Colon cancer, Colon polyps, Esophageal cancer, Rectal cancer, or Stomach cancer.  ROS:   Please see the history of present illness.     All other systems reviewed and are negative.  EKGs/Labs/Other Studies Reviewed:    The following studies were reviewed today:  EKG Interpretation Date/Time:  Friday August 01 2024 08:11:19 EST Ventricular Rate:  67 PR Interval:  134 QRS Duration:  70 QT Interval:  388 QTC Calculation: 409 R Axis:   41  Text Interpretation: Normal sinus rhythm Nonspecific T wave abnormality When compared with ECG of 25-Jul-2023 08:10, No significant change was found Confirmed by Madie Slough ([id number]) on 08/01/2024 8:15:32 AM    Recent Labs: No results found for requested labs within last 365 days.  Recent Lipid Panel No results found for: CHOL, TRIG, HDL, CHOLHDL, VLDL, LDLCALC, LDLDIRECT   Risk Assessment/Calculations:                 Physical Exam:    VS:  BP 118/74 (BP Location: Left Arm, Patient Position: Sitting, Cuff Size: Normal)   Pulse 67   Ht 5' 4 (1.626 m)   Wt 151 lb 6.4 oz (68.7 kg)   SpO2 97%   BMI 25.99 kg/m     Wt Readings from Last 3 Encounters:  08/01/24 151 lb 6.4 oz (68.7 kg)  07/25/23 148 lb (67.1 kg)  07/24/22 151 lb (68.5 kg)     GEN:  Well nourished, well developed in no acute distress HEENT: Normal NECK: No JVD; No carotid bruits LYMPHATICS: No lymphadenopathy CARDIAC: RRR, no murmurs, rubs, gallops RESPIRATORY:  Clear to auscultation without rales, wheezing or rhonchi  ABDOMEN: Soft, non-tender, non-distended MUSCULOSKELETAL:  No edema; No deformity  SKIN: Warm and dry NEUROLOGIC:  Alert and oriented x 3 PSYCHIATRIC:  Normal affect   ASSESSMENT:    1. Screening for cardiovascular condition   2. Hyperlipidemia with target LDL less than 70   3. Aortic atherosclerosis   4. Coronary artery calcification   5. Family history of heart disease    PLAN:    In order of problems listed above:  Hyperlipidemia with LDL goal < 70 20 mg crestor  -NMR profile continues to show good control - will check LFTs with PCP next month   Coronary calcification Family history of heart disease By coronary calcium  score - no chest pain - nonsmoker      Follow up with Dr. Mona in 1 year.      Medication Adjustments/Labs and Tests Ordered: Current medicines are reviewed at length with the patient today.  Concerns regarding medicines are outlined above.  Orders Placed This Encounter  Procedures   EKG 12-Lead   No orders of the defined types were placed in this encounter.   There are no Patient Instructions on file for this visit.   Signed, Slough Nat Madie, [place]  08/01/2024 8:37 AM    [facility]

## 2024-08-01 ENCOUNTER — Encounter: Payer: Self-pay | Admitting: Physician Assistant

## 2024-08-01 ENCOUNTER — Ambulatory Visit: Attending: Physician Assistant | Admitting: Physician Assistant

## 2024-08-01 VITALS — BP 118/74 | HR 67 | Ht 64.0 in | Wt 151.4 lb

## 2024-08-01 DIAGNOSIS — I700 Atherosclerosis of aorta: Secondary | ICD-10-CM

## 2024-08-01 DIAGNOSIS — E785 Hyperlipidemia, unspecified: Secondary | ICD-10-CM | POA: Diagnosis not present

## 2024-08-01 DIAGNOSIS — Z136 Encounter for screening for cardiovascular disorders: Secondary | ICD-10-CM

## 2024-08-01 DIAGNOSIS — I2510 Atherosclerotic heart disease of native coronary artery without angina pectoris: Secondary | ICD-10-CM | POA: Diagnosis not present

## 2024-08-01 DIAGNOSIS — Z8249 Family history of ischemic heart disease and other diseases of the circulatory system: Secondary | ICD-10-CM

## 2024-08-01 NOTE — Patient Instructions (Addendum)
 Medication Instructions:  Your physician recommends that you continue on your current medications as directed. Please refer to the Current Medication list given to you today.  *If you need a refill on your cardiac medications before your next appointment, please call your pharmacy*  Lab Work: NONE ordered at this time of appointment   Testing/Procedures: NONE ordered at this time of appointment    Follow-Up: At [facility], you and your health needs are our priority.  As part of our continuing mission to provide you with exceptional heart care, our providers are all part of one team.  This team includes your primary Cardiologist (physician) and Advanced Practice Providers or APPs (Physician Assistants and Nurse Practitioners) who all work together to provide you with the care you need, when you need it.  Your next appointment:   1 year(s)  Provider:   Vinie JAYSON Maxcy, MD  MD only  We recommend signing up for the patient portal called MyChart.  Sign up information is provided on this After Visit Summary.  MyChart is used to connect with patients for Virtual Visits (Telemedicine).  Patients are able to view lab/test results, encounter notes, upcoming appointments, etc.  Non-urgent messages can be sent to your provider as well.   To learn more about what you can do with MyChart, go to [email or web address].

## 2024-08-15 DIAGNOSIS — E785 Hyperlipidemia, unspecified: Secondary | ICD-10-CM | POA: Diagnosis not present

## 2024-08-15 DIAGNOSIS — Z125 Encounter for screening for malignant neoplasm of prostate: Secondary | ICD-10-CM | POA: Diagnosis not present

## 2024-08-22 DIAGNOSIS — Z0000 Encounter for general adult medical examination without abnormal findings: Secondary | ICD-10-CM | POA: Diagnosis not present

## 2024-08-22 DIAGNOSIS — Z1339 Encounter for screening examination for other mental health and behavioral disorders: Secondary | ICD-10-CM | POA: Diagnosis not present

## 2024-08-22 DIAGNOSIS — Z1331 Encounter for screening for depression: Secondary | ICD-10-CM | POA: Diagnosis not present

## 2024-08-22 DIAGNOSIS — Z23 Encounter for immunization: Secondary | ICD-10-CM | POA: Diagnosis not present

## 2024-08-22 DIAGNOSIS — R7301 Impaired fasting glucose: Secondary | ICD-10-CM | POA: Diagnosis not present

## 2024-09-26 ENCOUNTER — Other Ambulatory Visit: Payer: Self-pay | Admitting: Physician Assistant

## 2024-09-26 MED ORDER — ROSUVASTATIN CALCIUM 20 MG PO TABS: 20.0000 mg | ORAL_TABLET | Freq: Every day | ORAL | 3 refills | Status: AC

## 2024-12-16 ENCOUNTER — Ambulatory Visit: Admitting: Dermatology
